# Patient Record
Sex: Female | Born: 1960
Health system: Southern US, Community
[De-identification: ages and names within clinical notes are randomized; demographics above are authoritative.]

## PROBLEM LIST (undated history)

## (undated) DIAGNOSIS — K649 Unspecified hemorrhoids: Secondary | ICD-10-CM

## (undated) DIAGNOSIS — K644 Residual hemorrhoidal skin tags: Secondary | ICD-10-CM

## (undated) DIAGNOSIS — N816 Rectocele: Secondary | ICD-10-CM

## (undated) DIAGNOSIS — E049 Nontoxic goiter, unspecified: Secondary | ICD-10-CM

## (undated) HISTORY — PX: COLONOSCOPY: SHX174

## (undated) HISTORY — DX: Rectocele: N81.6

## (undated) HISTORY — PX: WISDOM TOOTH EXTRACTION: SHX21

## (undated) HISTORY — DX: Unspecified hemorrhoids: K64.9

## (undated) HISTORY — PX: BREAST BIOPSY: SHX20

## (undated) HISTORY — DX: Residual hemorrhoidal skin tags: K64.4

---

## 2008-08-18 ENCOUNTER — Emergency Department (HOSPITAL_COMMUNITY): Admission: EM | Admit: 2008-08-18 | Discharge: 2008-08-18 | Payer: Self-pay | Admitting: Family Medicine

## 2008-08-25 ENCOUNTER — Encounter: Admission: RE | Admit: 2008-08-25 | Discharge: 2008-08-25 | Payer: Self-pay | Admitting: Family Medicine

## 2008-10-12 ENCOUNTER — Encounter: Admission: RE | Admit: 2008-10-12 | Discharge: 2008-10-12 | Payer: Self-pay | Admitting: Family Medicine

## 2008-10-12 ENCOUNTER — Encounter (INDEPENDENT_AMBULATORY_CARE_PROVIDER_SITE_OTHER): Payer: Self-pay | Admitting: Interventional Radiology

## 2008-10-12 ENCOUNTER — Other Ambulatory Visit: Admission: RE | Admit: 2008-10-12 | Discharge: 2008-10-12 | Payer: Self-pay | Admitting: Interventional Radiology

## 2009-02-16 ENCOUNTER — Emergency Department (HOSPITAL_COMMUNITY): Admission: EM | Admit: 2009-02-16 | Discharge: 2009-02-16 | Payer: Self-pay | Admitting: Family Medicine

## 2009-10-05 ENCOUNTER — Encounter: Admission: RE | Admit: 2009-10-05 | Discharge: 2009-10-05 | Payer: Self-pay | Admitting: Endocrinology

## 2009-10-27 ENCOUNTER — Emergency Department (HOSPITAL_COMMUNITY): Admission: EM | Admit: 2009-10-27 | Discharge: 2009-10-27 | Payer: Self-pay | Admitting: Family Medicine

## 2010-12-23 ENCOUNTER — Other Ambulatory Visit: Payer: Self-pay | Admitting: Endocrinology

## 2010-12-23 DIAGNOSIS — E049 Nontoxic goiter, unspecified: Secondary | ICD-10-CM

## 2011-01-08 ENCOUNTER — Ambulatory Visit
Admission: RE | Admit: 2011-01-08 | Discharge: 2011-01-08 | Disposition: A | Discharge status: Home / Self Care | Payer: Commercial Managed Care - PPO | Source: Ambulatory Visit | Attending: Endocrinology | Admitting: Endocrinology

## 2011-01-08 DIAGNOSIS — E049 Nontoxic goiter, unspecified: Secondary | ICD-10-CM

## 2011-12-27 ENCOUNTER — Other Ambulatory Visit: Payer: Self-pay | Admitting: Endocrinology

## 2011-12-27 DIAGNOSIS — E049 Nontoxic goiter, unspecified: Secondary | ICD-10-CM

## 2012-04-09 ENCOUNTER — Ambulatory Visit
Admission: RE | Admit: 2012-04-09 | Discharge: 2012-04-09 | Disposition: A | Discharge status: Home / Self Care | Payer: 59 | Source: Ambulatory Visit | Attending: Endocrinology | Admitting: Endocrinology

## 2012-04-09 DIAGNOSIS — E049 Nontoxic goiter, unspecified: Secondary | ICD-10-CM

## 2013-09-03 ENCOUNTER — Other Ambulatory Visit: Payer: Self-pay | Admitting: Endocrinology

## 2013-09-03 DIAGNOSIS — E049 Nontoxic goiter, unspecified: Secondary | ICD-10-CM

## 2013-09-09 ENCOUNTER — Emergency Department (INDEPENDENT_AMBULATORY_CARE_PROVIDER_SITE_OTHER): Payer: 59

## 2013-09-09 ENCOUNTER — Encounter (HOSPITAL_COMMUNITY): Payer: Self-pay | Admitting: Emergency Medicine

## 2013-09-09 ENCOUNTER — Emergency Department (HOSPITAL_COMMUNITY)
Admission: EM | Admit: 2013-09-09 | Discharge: 2013-09-09 | Disposition: A | Discharge status: Home / Self Care | Payer: 59 | Source: Home / Self Care | Attending: Emergency Medicine | Admitting: Emergency Medicine

## 2013-09-09 DIAGNOSIS — J111 Influenza due to unidentified influenza virus with other respiratory manifestations: Secondary | ICD-10-CM

## 2013-09-09 DIAGNOSIS — J209 Acute bronchitis, unspecified: Secondary | ICD-10-CM

## 2013-09-09 DIAGNOSIS — J208 Acute bronchitis due to other specified organisms: Secondary | ICD-10-CM

## 2013-09-09 HISTORY — DX: Nontoxic goiter, unspecified: E04.9

## 2013-09-09 LAB — POCT RAPID STREP A: Streptococcus, Group A Screen (Direct): NEGATIVE

## 2013-09-09 MED ORDER — PREDNISONE 20 MG PO TABS: 20.0000 mg | ORAL_TABLET | Freq: Two times a day (BID) | ORAL | Status: DC

## 2013-09-09 MED ORDER — HYDROCOD POLST-CHLORPHEN POLST 10-8 MG/5ML PO LQCR: 5.0000 mL | Freq: Two times a day (BID) | ORAL | Status: DC | PRN

## 2013-09-09 MED ORDER — NAPROXEN 500 MG PO TABS: 500.0000 mg | ORAL_TABLET | Freq: Two times a day (BID) | ORAL | Status: DC

## 2013-09-09 MED ORDER — ALBUTEROL SULFATE HFA 108 (90 BASE) MCG/ACT IN AERS: 1.0000 | INHALATION_SPRAY | Freq: Four times a day (QID) | RESPIRATORY_TRACT | Status: DC | PRN

## 2013-09-09 NOTE — ED Notes (Signed)
C/o fever x 1 day and cough in Sept.  Cough never went away.  Fever last Wednesday, Cough became productive on Thursday.  Headache off and on since Monday 9/29- was nauseated on Monday.  Sore throat onset 10/6.  Swollen tonsils noted yesterday.

## 2013-09-09 NOTE — ED Provider Notes (Signed)
Chief Complaint:   Chief Complaint  Patient presents with  . 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 Over the past several days she developed a sore throat, wheezing, headache, nasal congestion, rhinorrhea, sneezing, and clear drainage. She had no definite sick exposures. She denies any GI symptoms.  Review of Systems:  Other than noted above, the patient denies any of the following symptoms: Systemic:  No fevers, chills, sweats, weight loss or gain, fatigue, or tiredness. Eye:  No redness or discharge. ENT:  No ear pain, drainage, headache, nasal congestion, drainage, sinus pressure, difficulty swallowing, or sore throat. Neck:  No neck pain or swollen glands. Lungs:  No cough, sputum production, hemoptysis, wheezing, chest tightness, shortness of breath or chest pain. GI:  No abdominal pain, nausea, vomiting or diarrhea.  PMFSH:  Past medical history, family history, social history, meds, and allergies were reviewed. She takes Loprox and for low thyroid.  Physical Exam:   Vital signs:  BP 126/77  Pulse 63  Temp(Src) 98.1 F (36.7 C) (Oral)  Resp 16  SpO2 98%  LMP 08/07/2013 General:  Alert and oriented.  In no distress.  Skin warm and dry. Eye:  No conjunctival injection or drainage. Lids were normal. ENT:  TMs and canals were normal, without erythema or inflammation.  Nasal mucosa was clear and uncongested, without drainage.  Mucous membranes were moist.  Pharynx was erythematous with no exudate or drainage.  There were no oral ulcerations or lesions. Neck:  Supple, no adenopathy, tenderness or mass. Lungs:  No respiratory distress.  Lungs were clear to auscultation, with very widely scattered wheezes, no rales or rhonchi.  Breath sounds were clear  and equal bilaterally.  Heart:  Regular rhythm, without gallops, murmers or rubs. Skin:  Clear, warm, and dry, without rash or lesions.  Labs:   Results for orders placed during the hospital encounter of 09/09/13  POCT RAPID STREP A ([facility]ARE ONLY)      Result Value Range   Streptococcus, Group A Screen (Direct) NEGATIVE  NEGATIVE     Radiology:  Dg Chest 2 View  09/09/2013   CLINICAL DATA:  Ten day history of cough  EXAM: CHEST  2 VIEW  COMPARISON:  02/16/2009  FINDINGS: The heart size and mediastinal contours are within normal limits. Both lungs are clear. The visualized skeletal structures are unremarkable.  IMPRESSION: No active cardiopulmonary disease.   Electronically Signed   By: Alcide Clever M.D.   On: 09/09/2013 18:36   Assessment:  The primary encounter diagnosis was Viral bronchitis. A diagnosis of Influenza-like illness was also pertinent to this visit.  No indication for antibiotics.  Plan:   1.  Meds:  The following meds were prescribed:   Discharge Medication List as of 09/09/2013  6:49 PM    START taking these medications   Details  albuterol (PROVENTIL HFA;VENTOLIN HFA) 108 (90 BASE) MCG/ACT inhaler Inhale 1-2 puffs into the lungs every 6 (six) hours as needed for wheezing., Starting 09/09/2013, Until Discontinued, Normal    chlorpheniramine-HYDROcodone (TUSSIONEX) 10-8 MG/5ML LQCR Take 5 mLs by mouth every 12 (twelve) hours as needed., Starting 09/09/2013, Until Discontinued, Normal    naproxen (NAPROSYN) 500 MG tablet Take 1 tablet (500 mg total) by mouth 2 (  two) times daily., Starting 09/09/2013, Until Discontinued, Normal    predniSONE (DELTASONE) 20 MG tablet Take 1 tablet (20 mg total) by mouth 2 (two) times daily., Starting 09/09/2013, Until Discontinued, Normal        2.  Patient Education/Counseling:  The patient was given appropriate handouts, self care instructions, and instructed in symptomatic relief.    3.  Follow up:  The patient was told to follow  up if no better in 3 to 4 days, if becoming worse in any way, and given some red flag symptoms such as fever or difficulty breathing which would prompt immediate return.  Follow up here if necessary.      Reuben Likes, MD 09/09/13 2213

## 2013-09-11 LAB — CULTURE, GROUP A STREP

## 2014-04-07 ENCOUNTER — Other Ambulatory Visit: Payer: 59

## 2014-04-14 ENCOUNTER — Other Ambulatory Visit: Payer: Self-pay | Admitting: Endocrinology

## 2014-04-14 DIAGNOSIS — E049 Nontoxic goiter, unspecified: Secondary | ICD-10-CM

## 2014-04-21 ENCOUNTER — Ambulatory Visit
Admission: RE | Admit: 2014-04-21 | Discharge: 2014-04-21 | Disposition: A | Discharge status: Home / Self Care | Payer: 59 | Source: Ambulatory Visit | Attending: Endocrinology | Admitting: Endocrinology

## 2014-04-21 DIAGNOSIS — E049 Nontoxic goiter, unspecified: Secondary | ICD-10-CM

## 2014-11-30 ENCOUNTER — Encounter: Payer: Self-pay | Admitting: Family Medicine

## 2014-11-30 DIAGNOSIS — E039 Hypothyroidism, unspecified: Secondary | ICD-10-CM | POA: Insufficient documentation

## 2014-11-30 DIAGNOSIS — E049 Nontoxic goiter, unspecified: Secondary | ICD-10-CM | POA: Insufficient documentation

## 2014-12-09 ENCOUNTER — Ambulatory Visit (INDEPENDENT_AMBULATORY_CARE_PROVIDER_SITE_OTHER): Payer: 59 | Admitting: Physician Assistant

## 2014-12-09 ENCOUNTER — Encounter: Payer: Self-pay | Admitting: Family Medicine

## 2014-12-09 ENCOUNTER — Encounter: Payer: Self-pay | Admitting: Physician Assistant

## 2014-12-09 VITALS — BP 132/94 | HR 60 | Temp 98.2°F | Resp 18 | Ht 65.25 in | Wt 209.0 lb

## 2014-12-09 DIAGNOSIS — Z803 Family history of malignant neoplasm of breast: Secondary | ICD-10-CM

## 2014-12-09 DIAGNOSIS — Z0000 Encounter for general adult medical examination without abnormal findings: Secondary | ICD-10-CM

## 2014-12-09 DIAGNOSIS — I10 Essential (primary) hypertension: Secondary | ICD-10-CM

## 2014-12-09 DIAGNOSIS — E049 Nontoxic goiter, unspecified: Secondary | ICD-10-CM

## 2014-12-09 LAB — CBC WITH DIFFERENTIAL/PLATELET
BASOS ABS: 0 10*3/uL (ref 0.0–0.1)
BASOS PCT: 0 % (ref 0–1)
EOS ABS: 0.3 10*3/uL (ref 0.0–0.7)
Eosinophils Relative: 3 % (ref 0–5)
HCT: 44.2 % (ref 36.0–46.0)
Hemoglobin: 14.9 g/dL (ref 12.0–15.0)
Lymphocytes Relative: 17 % (ref 12–46)
Lymphs Abs: 1.5 10*3/uL (ref 0.7–4.0)
MCH: 30.2 pg (ref 26.0–34.0)
MCHC: 33.7 g/dL (ref 30.0–36.0)
MCV: 89.7 fL (ref 78.0–100.0)
MONO ABS: 0.8 10*3/uL (ref 0.1–1.0)
MPV: 11 fL (ref 8.6–12.4)
Monocytes Relative: 9 % (ref 3–12)
NEUTROS ABS: 6.3 10*3/uL (ref 1.7–7.7)
NEUTROS PCT: 71 % (ref 43–77)
PLATELETS: 217 10*3/uL (ref 150–400)
RBC: 4.93 MIL/uL (ref 3.87–5.11)
RDW: 13.7 % (ref 11.5–15.5)
WBC: 8.9 10*3/uL (ref 4.0–10.5)

## 2014-12-09 LAB — LIPID PANEL
Cholesterol: 165 mg/dL (ref 0–200)
HDL: 56 mg/dL (ref >39)
LDL CALC: 72 mg/dL (ref 0–99)
TRIGLYCERIDES: 186 mg/dL — AB (ref <150)
Total CHOL/HDL Ratio: 2.9 Ratio
VLDL: 37 mg/dL (ref 0–40)

## 2014-12-09 LAB — COMPLETE METABOLIC PANEL WITH GFR
ALBUMIN: 3.9 g/dL (ref 3.5–5.2)
ALT: 13 U/L (ref 0–35)
AST: 16 U/L (ref 0–37)
Alkaline Phosphatase: 63 U/L (ref 39–117)
BUN: 11 mg/dL (ref 6–23)
CHLORIDE: 103 meq/L (ref 96–112)
CO2: 24 meq/L (ref 19–32)
Calcium: 8.8 mg/dL (ref 8.4–10.5)
Creat: 0.77 mg/dL (ref 0.50–1.10)
GFR, EST NON AFRICAN AMERICAN: 88 mL/min
GFR, Est African American: >89 mL/min
GLUCOSE: 91 mg/dL (ref 70–99)
POTASSIUM: 3.8 meq/L (ref 3.5–5.3)
SODIUM: 137 meq/L (ref 135–145)
Total Bilirubin: 0.8 mg/dL (ref 0.2–1.2)
Total Protein: 6.7 g/dL (ref 6.0–8.3)

## 2014-12-09 MED ORDER — LISINOPRIL 10 MG PO TABS: 10.0000 mg | ORAL_TABLET | Freq: Every day | ORAL | Status: DC

## 2014-12-09 NOTE — Progress Notes (Signed)
Patient ID: Amanda Sampson MRN: [id number], DOB: 1961-01-02, 54 y.o. Date of Encounter: 12/09/2014,   Chief Complaint: Physical (CPE)  HPI: 54 y.o. y/o female  here for CPE.   She is also being seen as a new patient to establish care here.  She says that she actually came here a couple of times many years ago. Says that some of her children and her husband come here.  Says that she came here when she first found out about her thyroid goiter and we referred her to Dr. Soyla Murphy. She continues to see Dr. Soyla Murphy regarding her thyroid. Says that she saw the OB/GYN lots back when she was having her children. He has had 5 children but the youngest of them is now 56 years old. Says otherwise she just really hasn't had any other medical care other than just going to an urgent care if needed.  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 She is concerned about her blood pressure being high. Says that at the lowest it is sometimes in the 130s over 80s but usually is towards 140s over 90s sometimes even 100.  No other complaints or concerns.   Review of Systems: Consitutional: No fever, chills, fatigue, night sweats, lymphadenopathy. No significant/unexplained weight changes. Eyes: No visual changes, eye redness, or discharge. ENT/Mouth: No ear pain, sore throat, nasal drainage, or sinus pain. Cardiovascular: No chest pressure,heaviness, tightness or squeezing, even with exertion. No increased shortness of breath or dyspnea on exertion.No palpitations, edema, orthopnea, PND. Respiratory: No cough, hemoptysis, SOB, or wheezing. Gastrointestinal: No anorexia, dysphagia, reflux, pain, nausea, vomiting, hematemesis, diarrhea, constipation, BRBPR, or melena. Breast: No mass, nodules, bulging, or retraction. No skin changes or inflammation. No nipple discharge. No lymphadenopathy. Genitourinary: No dysuria, hematuria, incontinence, vaginal discharge, pruritis, burning, abnormal bleeding,  or pain. Musculoskeletal: No decreased ROM, No joint pain or swelling. No significant pain in neck, back, or extremities. Skin: No rash, pruritis, or concerning lesions. Neurological: No headache, dizziness, syncope, seizures, tremors, memory loss, coordination problems, or paresthesias. Psychological: No anxiety, depression, hallucinations, SI/HI. Endocrine: No polydipsia, polyphagia, polyuria, or known diabetes.No increased fatigue. No palpitations/rapid heart rate. No significant/unexplained weight change. All other systems were reviewed and are otherwise negative.  Past Medical History  Diagnosis Date  . Goiter     hypothyroid     History reviewed. No pertinent past surgical history.  Home Meds:  Outpatient Prescriptions Prior to Visit  Medication Sig Dispense Refill  . ibuprofen (ADVIL,MOTRIN) 200 MG tablet Take 600 mg by mouth every 6 (six) hours as needed for pain.    Marland Kitchen levothyroxine (SYNTHROID, LEVOTHROID) 112 MCG tablet Take 112 mcg by mouth daily before breakfast.    . Multiple Vitamin (MULTIVITAMIN) tablet Take 1 tablet by mouth daily.    Marland Kitchen albuterol (PROVENTIL HFA;VENTOLIN HFA) 108 (90 BASE) MCG/ACT inhaler Inhale 1-2 puffs into the lungs every 6 (six) hours as needed for wheezing. 1 Inhaler 0  . chlorpheniramine-HYDROcodone (TUSSIONEX) 10-8 MG/5ML LQCR Take 5 mLs by mouth every 12 (twelve) hours as needed. 140 mL 0  . naproxen (NAPROSYN) 500 MG tablet Take 1 tablet (500 mg total) by mouth 2 (two) times daily. 30 tablet 0  . predniSONE (DELTASONE) 20 MG tablet Take 1 tablet (20 mg total) by mouth 2 (two) times daily. 10 tablet 0   No facility-administered medications prior to visit.    Allergies: No Known Allergies  History   Social History  . Marital Status:  Married    Spouse Name: N/A    Number of Children: N/A  . Years of Education: N/A   Occupational History  . Not on file.   Social History Main Topics  . Smoking status: Never Smoker   . Smokeless tobacco:  Never Used  . Alcohol Use: No  . Drug Use: No  . Sexual Activity: No   Other Topics Concern  . 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   She is married.   She has 5 children. The youngest is now 25 years old.    Family History  Problem Relation Age of Onset  . Breast cancer Mother   . Cancer Mother 15    breast  . Pancreatic cancer Father   . Cancer Father     pancreatic  . Hearing loss Father   . Lymphoma Maternal Grandfather   . Cancer Maternal Grandfather     lymphoma  . Alcohol abuse Brother   . Diabetes Maternal Grandmother   . Hearing loss Brother   . Learning disabilities Brother   . Depression Brother   . Depression Brother   . Diabetes Sister     Physical Exam: Blood pressure 132/94, pulse 60, temperature 98.2 F (36.8 C), temperature source Oral, resp. rate 18, height 5' 5.25" (1.657 m), weight 209 lb (94.802 kg)., Body mass index is 34.53 kg/(m^2). General: Obese WF. Appears in no acute distress. HEENT: Normocephalic, atraumatic. Conjunctiva pink, sclera non-icteric. Pupils 2 mm constricting to 1 mm, round, regular, and equally reactive to light and accomodation. EOMI. Internal auditory canal clear. TMs with good cone of light and without pathology. Nasal mucosa pink. Nares are without discharge. No sinus tenderness. Oral mucosa pink.  Pharynx without exudate.   Neck: Supple. Trachea midline. No thyromegaly. Full ROM. No lymphadenopathy.No Carotid Bruits. Lungs: Clear to auscultation bilaterally without wheezes, rales, or rhonchi. Breathing is of normal effort and unlabored. Cardiovascular: RRR with S1 S2. No murmurs, rubs, or gallops. Distal pulses 2+ symmetrically. No carotid or abdominal bruits. Breast: Symmetrical. No masses. Nipples without discharge. Abdomen: Soft, non-tender, non-distended with normoactive bowel sounds. No hepatosplenomegaly or masses. No rebound/guarding.  No CVA tenderness. No hernias.  Genitourinary:  External genitalia without lesions. Vaginal mucosa pink.No discharge present. Cervix pink and without discharge. No cervical tenderness.Normal uterus size. No adnexal mass or tenderness.  Pap smear taken Musculoskeletal: Full range of motion and 5/5 strength throughout. Without swelling, atrophy, tenderness, crepitus, or warmth. Extremities without clubbing, cyanosis, or edema. Calves supple. Skin: Warm and moist without erythema, ecchymosis, wounds, or rash. Neuro: A+Ox3. CN II-XII grossly intact. Moves all extremities spontaneously. Full sensation throughout. Normal gait. DTR 2+ throughout upper and lower extremities. Finger to nose intact. Psych:  Responds to questions appropriately with a normal affect.   Assessment/Plan:  54 y.o. y/o female here for CPE 1. Visit for preventive health examination  A. Screening Labs:  - CBC with Differential - COMPLETE METABOLIC PANEL WITH GFR - Lipid panel - Vit D  25 hydroxy (rtn osteoporosis monitoring)  B. Pap: - PAP, Thin Prep w/HPV rflx HPV Type 16/18  C. Screening Mammogram: - MM Digital Screening; Future   D. DEXA/BMD:  Wait until closer to age 2 to start doing density.  E. Colorectal Cancer Screening: Discussed screening with patient today. Discussed scheduling for colonoscopy but she defers at this time. Discussed Hemoccults but she  has significant hemorrhoids so I do not think that Hemoccults will get [place] far . If they are positive then we will just simply think that they're secondary to the hemorrhoids. She says that she will be considering colonoscopy and will let us know if she wants [place] to schedule but right now refuses. She is well aware of risk versus benefits of me at discussed this today.  F. Immunizations:  Influenza: She received influenza vaccine 09/02/14 Tetanus:  She received TD 12/04/1995.----- this will be due next year in 2017 Pneumococcal: She has no indication to require  a pneumonia vaccine until age 4. Zostavax: Not indicated until age 21.   2. Family history of breast cancer in mother - MM Digital Screening; Future   3. Essential hypertension - lisinopril (PRINIVIL,ZESTRIL) 10 MG tablet; Take 1 tablet (10 mg total) by mouth daily.  Dispense: 30 tablet; Refill: 0 - BASIC METABOLIC PANEL WITH GFR; Future  Will add lisinopril 10 mg daily for blood pressure. 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 She can return here in 2 weeks to recheck BMP T on medication. If this is normal and her blood pressure is controlled then she can wait 6 months for follow-up office visit.  4. Goiter Per Dr. Soyla Murphy  Return in 2 weeks to check BMP T. Regular office visit 6 months or sooner if needed.   Signed, [address] [place], [place], [facility] 12/09/2014 10:12 AM

## 2014-12-10 LAB — VITAMIN D 25 HYDROXY (VIT D DEFICIENCY, FRACTURES): VIT D 25 HYDROXY: 20 ng/mL — AB (ref 30–100)

## 2014-12-13 LAB — PAP, THIN PREP W/HPV RFLX HPV TYPE 16/18: HPV DNA HIGH RISK: NOT DETECTED

## 2014-12-15 ENCOUNTER — Encounter: Payer: Self-pay | Admitting: Family Medicine

## 2014-12-15 ENCOUNTER — Ambulatory Visit: Payer: 59 | Admitting: Physician Assistant

## 2014-12-28 ENCOUNTER — Other Ambulatory Visit: Payer: 59

## 2014-12-28 DIAGNOSIS — I10 Essential (primary) hypertension: Secondary | ICD-10-CM

## 2014-12-29 LAB — BASIC METABOLIC PANEL WITH GFR
BUN: 16 mg/dL (ref 6–23)
CHLORIDE: 104 meq/L (ref 96–112)
CO2: 29 meq/L (ref 19–32)
CREATININE: 0.74 mg/dL (ref 0.50–1.10)
Calcium: 9.1 mg/dL (ref 8.4–10.5)
GLUCOSE: 108 mg/dL — AB (ref 70–99)
POTASSIUM: 3.9 meq/L (ref 3.5–5.3)
SODIUM: 139 meq/L (ref 135–145)

## 2015-01-06 ENCOUNTER — Telehealth: Payer: Self-pay | Admitting: Family Medicine

## 2015-01-06 DIAGNOSIS — I10 Essential (primary) hypertension: Secondary | ICD-10-CM

## 2015-01-06 MED ORDER — LISINOPRIL 10 MG PO TABS: 10.0000 mg | ORAL_TABLET | Freq: Every day | ORAL | Status: DC

## 2015-01-06 NOTE — Telephone Encounter (Signed)
Medication refilled per protocol. 

## 2015-04-20 ENCOUNTER — Other Ambulatory Visit: Payer: Self-pay | Admitting: Endocrinology

## 2015-04-20 DIAGNOSIS — E038 Other specified hypothyroidism: Secondary | ICD-10-CM

## 2015-05-13 ENCOUNTER — Encounter: Payer: Self-pay | Admitting: Family Medicine

## 2015-05-13 ENCOUNTER — Ambulatory Visit (INDEPENDENT_AMBULATORY_CARE_PROVIDER_SITE_OTHER): Payer: 59 | Admitting: Family Medicine

## 2015-05-13 VITALS — BP 118/72 | HR 66 | Temp 98.4°F | Resp 14 | Ht 65.25 in | Wt 203.0 lb

## 2015-05-13 DIAGNOSIS — D485 Neoplasm of uncertain behavior of skin: Secondary | ICD-10-CM | POA: Diagnosis not present

## 2015-05-13 NOTE — Progress Notes (Signed)
   Subjective:    Patient ID: Amanda Sampson, female    DOB: 10-10-1961, 54 y.o.   MRN: [id number]  HPI Patient has a rapidly enlarging lesion below her left eye. It is approximately 5 mm in size today it is an erythematous domed papule. It appears to be either an inflamed dermal nevus, a pyogenic granuloma, or possibly a basal cell cancer. She is here today requesting a biopsy and surgical removal. However this lesion is located in close proximity to her left lower eyelid. Given its location and its size, I think she would be better managed by dermatologist with a more appealing cosmetic result then what I am capable of.  Patient understands and would like a referral to dermatology Past Medical History  Diagnosis Date  . Goiter     hypothyroid   No past surgical history on file. Current Outpatient Prescriptions on File Prior to Visit  Medication Sig Dispense Refill  . ibuprofen (ADVIL,MOTRIN) 200 MG tablet Take 600 mg by mouth every 6 (six) hours as needed for pain.    Marland Kitchen levothyroxine (SYNTHROID, LEVOTHROID) 112 MCG tablet Take 112 mcg by mouth daily before breakfast.    . Multiple Vitamin (MULTIVITAMIN) tablet Take 1 tablet by mouth daily.    Marland Kitchen lisinopril (PRINIVIL,ZESTRIL) 10 MG tablet Take 1 tablet (10 mg total) by mouth daily. (Patient not taking: Reported on 05/13/2015) 90 tablet 1   No current facility-administered medications on file prior to visit.   No Known Allergies History   Social History  . Marital Status: Married    Spouse Name: N/A  . Number of Children: N/A  . Years of Education: N/A   Occupational History  . Not on file.   Social History Main Topics  . Smoking status: Never Smoker   . Smokeless tobacco: Never Used  . Alcohol Use: No  . Drug Use: No  . Sexual Activity: No   Other Topics Concern  . 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   She is married.   She has 5  children. The youngest is now 29 years old.      Review of Systems  All other systems reviewed and are negative.      Objective:   Physical Exam  Vitals reviewed.  please see the description in the history of present illness        Assessment & Plan:  Neoplasm of uncertain behavior of skin  I will schedule the patient see a dermatologist. I apologized to the patient for any waste of time today was however I just feel a dermatologist can do a better job than I'm capable of in removing this lesion

## 2015-06-08 ENCOUNTER — Ambulatory Visit: Payer: 59 | Admitting: Physician Assistant

## 2015-06-15 ENCOUNTER — Encounter (HOSPITAL_COMMUNITY): Payer: Self-pay | Admitting: *Deleted

## 2015-06-15 ENCOUNTER — Emergency Department (INDEPENDENT_AMBULATORY_CARE_PROVIDER_SITE_OTHER): Payer: 59

## 2015-06-15 ENCOUNTER — Emergency Department (HOSPITAL_COMMUNITY)
Admission: EM | Admit: 2015-06-15 | Discharge: 2015-06-15 | Disposition: A | Discharge status: Home / Self Care | Payer: 59 | Source: Home / Self Care | Attending: Family Medicine | Admitting: Family Medicine

## 2015-06-15 DIAGNOSIS — J189 Pneumonia, unspecified organism: Secondary | ICD-10-CM | POA: Diagnosis not present

## 2015-06-15 MED ORDER — MOXIFLOXACIN HCL 400 MG PO TABS: 400.0000 mg | ORAL_TABLET | Freq: Every day | ORAL | Status: DC

## 2015-06-15 MED ORDER — CEFTRIAXONE SODIUM 1 G IJ SOLR
1.0000 g | Freq: Once | INTRAMUSCULAR | Status: AC
  Administered 2015-06-15: 1 g via INTRAMUSCULAR

## 2015-06-15 MED ORDER — CEFTRIAXONE SODIUM 1 G IJ SOLR
INTRAMUSCULAR | Status: AC
  Filled 2015-06-15: qty 10

## 2015-06-15 NOTE — Discharge Instructions (Signed)
Drink plenty of fluids as discussed, use medicine as prescribed, and mucinex or delsym for cough. see your doctor in 3-4 wks for repeat xray, sooner if any problems.

## 2015-06-15 NOTE — ED Notes (Signed)
Pt  Reports   Symptoms  Of  Cough   /   Congested      With  Fever  At  Home   With  Onset  Of  Symptoms      X  4  Days     She  Reports  Some tightness in  Her  Chest  And  Reports  Used an albuterol inhaler  Which  She  States  She  Had  At home

## 2015-06-15 NOTE — ED Provider Notes (Signed)
CSN: [id number]     Arrival date & time 06/15/15  1357 History   First MD Initiated Contact with Patient 06/15/15 1455     Chief Complaint  Patient presents with  . URI   (Consider location/radiation/quality/duration/timing/severity/associated sxs/prior Treatment) Patient is a 54 y.o. female presenting with URI. The history is provided by the patient.  URI Presenting symptoms: congestion, cough and fever   Presenting symptoms: no rhinorrhea and no sore throat   Severity:  Moderate Onset quality:  Gradual Duration:  5 days Progression:  Worsening Chronicity:  New Relieved by:  None tried Worsened by:  Nothing tried Ineffective treatments:  None tried Associated symptoms: no wheezing   Risk factors: sick contacts     Past Medical History  Diagnosis Date  . Goiter     hypothyroid   History reviewed. No pertinent past surgical history. Family History  Problem Relation Age of Onset  . Breast cancer Mother   . Cancer Mother 68    breast  . Pancreatic cancer Father   . Cancer Father     pancreatic  . Hearing loss Father   . Lymphoma Maternal Grandfather   . Cancer Maternal Grandfather     lymphoma  . Alcohol abuse Brother   . Diabetes Maternal Grandmother   . Hearing loss Brother   . Learning disabilities Brother   . Depression Brother   . Depression Brother   . Diabetes Sister    History  Substance Use Topics  . Smoking status: Never Smoker   . Smokeless tobacco: Never Used  . Alcohol Use: No   OB History    No data available     Review of Systems  Constitutional: Positive for fever.  HENT: Positive for congestion. Negative for rhinorrhea and sore throat.   Respiratory: Positive for cough. Negative for shortness of breath and wheezing.   Cardiovascular: Negative.     Allergies  Review of patient's allergies indicates no known allergies.  Home Medications   Prior to Admission medications   Medication Sig Start Date End Date Taking? Authorizing  Provider  ibuprofen (ADVIL,MOTRIN) 200 MG tablet Take 600 mg by mouth every 6 (six) hours as needed for pain.    Historical Provider, MD  levothyroxine (SYNTHROID, LEVOTHROID) 112 MCG tablet Take 112 mcg by mouth daily before breakfast.    Historical Provider, MD  lisinopril (PRINIVIL,ZESTRIL) 10 MG tablet Take 1 tablet (10 mg total) by mouth daily. Patient not taking: Reported on 05/13/2015 01/06/15   Orlena Sheldon, PA-C  moxifloxacin (AVELOX) 400 MG tablet Take 1 tablet (400 mg total) by mouth daily. One tab daily 06/15/15   Billy Fischer, MD  Multiple Vitamin (MULTIVITAMIN) tablet Take 1 tablet by mouth daily.    Historical Provider, MD   BP 124/76 mmHg  Pulse 88  Temp(Src) 99 F (37.2 C) (Oral)  Resp 18  SpO2 96%  LMP  (LMP Unknown) Physical Exam  Constitutional: She is oriented to person, place, and time. She appears well-developed and well-nourished. No distress.  HENT:  Mouth/Throat: Oropharynx is clear and moist.  Neck: Normal range of motion. Neck supple.  Cardiovascular: Normal rate, regular rhythm, normal heart sounds and intact distal pulses.   Pulmonary/Chest: Effort normal. She has decreased breath sounds. She has rales in the left lower field.  Lymphadenopathy:    She has no cervical adenopathy.  Neurological: She is alert and oriented to person, place, and time.  Skin: Skin is warm and dry.  Nursing note and vitals  reviewed.   ED Course  Procedures (including critical care time) Labs Review Labs Reviewed - No data to display  Imaging Review Dg Chest 2 View  06/15/2015   CLINICAL DATA:  54 year old female with fever, cough, chills and wheezing  EXAM: CHEST  2 VIEW  COMPARISON:  Prior chest x-ray 09/09/2013  FINDINGS: Mild of focal patchy airspace opacity in the anterior aspect of the left lower lobe superimposed on the left heart shadow on the frontal view. This is an interval finding compared to the prior radiograph. The cardiac and mediastinal contours are within  normal limits. The lungs are otherwise clear. No acute osseous abnormality.  IMPRESSION: New patchy airspace opacity in the anterior aspect of the left lower lobe concerning for early bronchopneumonia. Followup PA and lateral chest X-ray is recommended in 3-4 weeks following trial of antibiotic therapy to ensure resolution and exclude underlying malignancy.   Electronically Signed   By: Jacqulynn Cadet M.D.   On: 06/15/2015 15:29     MDM   1. CAP (community acquired pneumonia)        Billy Fischer, MD 06/15/15 [phone]

## 2015-07-13 ENCOUNTER — Ambulatory Visit: Payer: 59 | Admitting: Family Medicine

## 2015-07-15 ENCOUNTER — Ambulatory Visit: Payer: 59 | Admitting: Family Medicine

## 2015-09-21 ENCOUNTER — Encounter: Payer: Self-pay | Admitting: Physician Assistant

## 2016-01-06 MED FILL — LEVOTHYROXINE 112 MCG TAB: 112 | 90 days supply | Qty: 90 | Fill #0

## 2016-04-11 ENCOUNTER — Ambulatory Visit
Admission: RE | Admit: 2016-04-11 | Discharge: 2016-04-11 | Disposition: A | Discharge status: Home / Self Care | Payer: 59 | Source: Ambulatory Visit | Attending: Endocrinology | Admitting: Endocrinology

## 2016-04-11 DIAGNOSIS — E042 Nontoxic multinodular goiter: Secondary | ICD-10-CM | POA: Diagnosis not present

## 2016-04-11 DIAGNOSIS — E039 Hypothyroidism, unspecified: Secondary | ICD-10-CM | POA: Diagnosis not present

## 2016-04-11 DIAGNOSIS — E038 Other specified hypothyroidism: Secondary | ICD-10-CM

## 2016-04-18 DIAGNOSIS — E049 Nontoxic goiter, unspecified: Secondary | ICD-10-CM | POA: Diagnosis not present

## 2016-04-18 DIAGNOSIS — E039 Hypothyroidism, unspecified: Secondary | ICD-10-CM | POA: Diagnosis not present

## 2016-04-19 MED FILL — LEVOTHYROXINE 112 MCG TAB: 112 | 90 days supply | Qty: 90 | Fill #1

## 2016-07-24 MED FILL — LEVOTHYROXINE 112 MCG TAB: 112 | 90 days supply | Qty: 90 | Fill #2

## 2016-09-27 ENCOUNTER — Ambulatory Visit (INDEPENDENT_AMBULATORY_CARE_PROVIDER_SITE_OTHER): Payer: 59 | Admitting: Physician Assistant

## 2016-09-27 ENCOUNTER — Encounter: Payer: Self-pay | Admitting: Physician Assistant

## 2016-09-27 VITALS — BP 118/76 | HR 70 | Temp 97.9°F | Resp 16 | Ht 65.25 in | Wt 192.0 lb

## 2016-09-27 DIAGNOSIS — Z23 Encounter for immunization: Secondary | ICD-10-CM | POA: Diagnosis not present

## 2016-09-27 DIAGNOSIS — Z0000 Encounter for general adult medical examination without abnormal findings: Secondary | ICD-10-CM

## 2016-09-27 LAB — CBC WITH DIFFERENTIAL/PLATELET
BASOS ABS: 0 {cells}/uL (ref 0–200)
BASOS PCT: 0 %
EOS ABS: 320 {cells}/uL (ref 15–500)
Eosinophils Relative: 5 %
HEMATOCRIT: 43.9 % (ref 35.0–45.0)
Hemoglobin: 14.6 g/dL (ref 12.0–15.0)
LYMPHS PCT: 24 %
Lymphs Abs: 1536 cells/uL (ref 850–3900)
MCH: 30.5 pg (ref 27.0–33.0)
MCHC: 33.3 g/dL (ref 32.0–36.0)
MCV: 91.6 fL (ref 80.0–100.0)
MONO ABS: 704 {cells}/uL (ref 200–950)
MPV: 11.4 fL (ref 7.5–12.5)
Monocytes Relative: 11 %
Neutro Abs: 3840 cells/uL (ref 1500–7800)
Neutrophils Relative %: 60 %
Platelets: 188 10*3/uL (ref 140–400)
RBC: 4.79 MIL/uL (ref 3.80–5.10)
RDW: 13.5 % (ref 11.0–15.0)
WBC: 6.4 10*3/uL (ref 3.8–10.8)

## 2016-09-27 LAB — LIPID PANEL
CHOLESTEROL: 178 mg/dL (ref 125–200)
HDL: 53 mg/dL (ref >=46)
LDL Cholesterol: 97 mg/dL (ref <130)
TRIGLYCERIDES: 141 mg/dL (ref <150)
Total CHOL/HDL Ratio: 3.4 Ratio (ref <=5.0)
VLDL: 28 mg/dL (ref <30)

## 2016-09-27 LAB — COMPLETE METABOLIC PANEL WITH GFR
ALT: 13 U/L (ref 6–29)
AST: 16 U/L (ref 10–35)
Albumin: 4.1 g/dL (ref 3.6–5.1)
Alkaline Phosphatase: 78 U/L (ref 33–130)
BILIRUBIN TOTAL: 0.6 mg/dL (ref 0.2–1.2)
BUN: 14 mg/dL (ref 7–25)
CALCIUM: 9.5 mg/dL (ref 8.6–10.4)
CO2: 27 mmol/L (ref 20–31)
CREATININE: 0.93 mg/dL (ref 0.50–1.05)
Chloride: 103 mmol/L (ref 98–110)
GFR, EST AFRICAN AMERICAN: 79 mL/min (ref >=60)
GFR, EST NON AFRICAN AMERICAN: 69 mL/min (ref >=60)
Glucose, Bld: 84 mg/dL (ref 70–99)
Potassium: 3.9 mmol/L (ref 3.5–5.3)
Sodium: 141 mmol/L (ref 135–146)
TOTAL PROTEIN: 7.1 g/dL (ref 6.1–8.1)

## 2016-09-27 NOTE — Progress Notes (Signed)
Patient ID: Amanda Sampson MRN: [id number], DOB: 05/03/1961, 55 y.o. Date of Encounter: 09/27/2016,   Chief Complaint: Physical (CPE)  HPI: 55 y.o. y/o female  here for CPE.    12/09/2014: She presents for CPE. She is also being seen as a new patient to establish care here.  She says that she actually came here a couple of times many years ago. Says that some of her children and her husband come here.  Says that she came here when she first found out about her thyroid goiter and we referred her to Dr. Soyla Sampson. She continues to see Dr. Soyla Sampson regarding her thyroid. Says that she saw the OB/GYN lots back when she was having her children. She has had 5 children but the youngest of them is now 56 years old. Says otherwise she just really hasn't had any other medical care other than just going to an urgent care if needed.  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 She is concerned about her blood pressure being high. Says that at the lowest-- it is sometimes in the 130s over 80s but usually is towards 140s over 90s sometimes even 100.  09/27/2016: She presents for complete physical exam. 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 They "help to provide them with things that they need to get back on their feet". She states that she is still working at [employer] but only 12 hours a week--"enough to keep her insurance through them." I noted that her blood pressure pill had been marked discontinued/no longer taking----she states that she thinks that back last year when she had to start that that it must of been secondary to increased stress. Says that [facility] and [facility] pressure has been good off of medication. She has no complaints or concerns today. She still sees Dr.  Soyla Sampson for her thyroid.  No other complaints or concerns.   Review of Systems: Consitutional: No fever, chills, fatigue, night sweats, lymphadenopathy. No significant/unexplained weight changes. Eyes: No visual changes, eye redness, or discharge. ENT/Mouth: No ear pain, sore throat, nasal drainage, or sinus pain. Cardiovascular: No chest pressure,heaviness, tightness or squeezing, even with exertion. No increased shortness of breath or dyspnea on exertion.No palpitations, edema, orthopnea, PND. Respiratory: No cough, hemoptysis, SOB, or wheezing. Gastrointestinal: No anorexia, dysphagia, reflux, pain, nausea, vomiting, hematemesis, diarrhea, constipation, BRBPR, or melena. Breast: No mass, nodules, bulging, or retraction. No skin changes or inflammation. No nipple discharge. No lymphadenopathy. Genitourinary: No dysuria, hematuria, incontinence, vaginal discharge, pruritis, burning, abnormal bleeding, or pain. Musculoskeletal: No decreased ROM, No joint pain or swelling. No significant pain in neck, back, or extremities. Skin: No rash, pruritis, or concerning lesions. Neurological: No headache, dizziness, syncope, seizures, tremors, memory loss, coordination problems, or paresthesias. Psychological: No anxiety, depression, hallucinations, SI/HI. Endocrine: No polydipsia, polyphagia, polyuria, or known diabetes.No increased fatigue. No palpitations/rapid heart rate. No significant/unexplained weight change. All other systems were reviewed and are otherwise negative.  Past Medical History:  Diagnosis Date  . Goiter    hypothyroid     No past surgical history on file.  Home Meds:  Outpatient Medications Prior to Visit  Medication Sig Dispense Refill  . ibuprofen (ADVIL,MOTRIN) 200 MG tablet Take 600 mg by mouth  every 6 (six) hours as needed for pain.    Marland Kitchen levothyroxine (SYNTHROID, LEVOTHROID) 112 MCG tablet Take 112 mcg by mouth daily before breakfast.    . Multiple Vitamin  (MULTIVITAMIN) tablet Take 1 tablet by mouth daily.    Marland Kitchen lisinopril (PRINIVIL,ZESTRIL) 10 MG tablet Take 1 tablet (10 mg total) by mouth daily. (Patient not taking: Reported on 09/27/2016) 90 tablet 1  . moxifloxacin (AVELOX) 400 MG tablet Take 1 tablet (400 mg total) by mouth daily. One tab daily (Patient not taking: Reported on 09/27/2016) 7 tablet 0   No facility-administered medications prior to visit.     Allergies: No Known Allergies  Social History   Social History  . Marital status: Married    Spouse name: N/A  . Number of children: N/A  . Years of education: N/A   Occupational History  . Not on file.   Social History Main Topics  . Smoking status: Never Smoker  . Smokeless tobacco: Never Used  . Alcohol use No  . Drug use: No  . Sexual activity: No   Other Topics Concern  . 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   She is married.   She has 5 children. The youngest is now 90 years old.    Family History  Problem Relation Age of Onset  . Breast cancer Mother   . Cancer Mother 11    breast  . Pancreatic cancer Father   . Cancer Father     pancreatic  . Hearing loss Father   . Lymphoma Maternal Grandfather   . Cancer Maternal Grandfather     lymphoma  . Alcohol abuse Brother   . Diabetes Maternal Grandmother   . Hearing loss Brother   . Learning disabilities Brother   . Depression Brother   . Depression Brother   . Diabetes Sister     Physical Exam: Blood pressure 118/76, pulse 70, temperature 97.9 F (36.6 C), temperature source Oral, resp. rate 16, height 5' 5.25" (1.657 m), weight 192 lb (87.1 kg), SpO2 98 %., There is no height or weight on file to calculate BMI. General: Obese WF. Appears in no acute distress. HEENT: Normocephalic, atraumatic. Conjunctiva pink, sclera non-icteric. Pupils 2 mm constricting to 1 mm, round, regular, and equally reactive to light and  accomodation. EOMI. Internal auditory canal clear. TMs with good cone of light and without pathology. Nasal mucosa pink. Nares are without discharge. No sinus tenderness. Oral mucosa pink.  Pharynx without exudate.   Neck: Supple. Trachea midline. No thyromegaly. Full ROM. No lymphadenopathy.No Carotid Bruits. Lungs: Clear to auscultation bilaterally without wheezes, rales, or rhonchi. Breathing is of normal effort and unlabored. Cardiovascular: RRR with S1 S2. No murmurs, rubs, or gallops. Distal pulses 2+ symmetrically. No carotid or abdominal bruits. Breast: Symmetrical. No masses. Nipples without discharge. Abdomen: Soft, non-tender, non-distended with normoactive bowel sounds. No hepatosplenomegaly or masses. No rebound/guarding. No CVA tenderness. No hernias.  Genitourinary:  External genitalia without lesions. Vaginal mucosa pink.No discharge present. Cervix pink and without discharge. No cervical tenderness.Normal uterus size. No adnexal mass or tenderness.  Musculoskeletal: Full range of motion and 5/5 strength throughout. Without swelling, atrophy, tenderness, crepitus, or warmth. Extremities without clubbing, cyanosis, or edema. Calves supple. Skin: Warm and moist without erythema, ecchymosis, wounds, or rash. Neuro: A+Ox3. CN II-XII grossly intact. Moves all extremities spontaneously. Full sensation throughout. Normal gait.  DTR 2+ throughout upper and lower extremities. Finger to nose intact. Psych:  Responds to questions appropriately with a normal affect.   Assessment/Plan:  55 y.o. y/o female here for CPE  1. Visit for preventive health examination  A. Screening Labs: - CBC with Differential - COMPLETE METABOLIC PANEL WITH GFR - Lipid panel (Will not check TSH as Dr. Soyla Sampson manages her thyroid)  B. Pap: - PAP, Thin Prep w/HPV rflx HPV Type 16/18----this was checked at last physical and was normal cytology and negative HPV. Can wait 3-5 years to repeat.  C. Screening  Mammogram: Ordered a mammogram at her last physical but today she states that she did not go have that done. I discussed that she really needs to have this done especially given her family history and she voices understanding and agrees. - MM Digital Screening; Future   D. DEXA/BMD:  Wait until closer to age 44 to start doing density.  E. Colorectal Cancer Screening: Discussed screening with patient today. Discussed scheduling for colonoscopy but she defers at this time. Discussed Hemoccults but she has significant hemorrhoids so I do not think that Hemoccults will get [place] far . If they are positive then we will just simply think that they're secondary to the hemorrhoids. She says that she will be considering colonoscopy and will let us know if she wants [place] to schedule but right now refuses. She is well aware of risk versus benefits of me at discussed this today. He had the above conversation at her first physical with me and discuss this again at visit 09/2016 but she still refuses colonoscopy or Hemoccult.  F. Immunizations:  Influenza: She received influenza vaccine 09/02/14, 08/26/2016 Tetanus:  She received TD 12/04/1995.----- this is due--she is agreeable to receive today---Given here 09/27/2016 Pneumococcal: She has no indication to require a pneumonia vaccine until age 53. Zostavax: Not indicated until age 73.   2. Family history of breast cancer in mother - MM Digital Screening; Future  3. Goiter Per Dr. Soyla Sampson     Signed, [facility] [place], [place], [facility] 09/27/2016 8:49 AM

## 2016-09-27 NOTE — Addendum Note (Signed)
Addended by: Vonna Kotyk A on: 09/27/2016 10:00 AM   Modules accepted: Orders

## 2016-09-28 ENCOUNTER — Other Ambulatory Visit: Payer: Self-pay | Admitting: Family Medicine

## 2016-09-28 DIAGNOSIS — Z1231 Encounter for screening mammogram for malignant neoplasm of breast: Secondary | ICD-10-CM

## 2016-10-17 ENCOUNTER — Ambulatory Visit: Payer: 59

## 2016-10-30 ENCOUNTER — Ambulatory Visit
Admission: RE | Admit: 2016-10-30 | Discharge: 2016-10-30 | Disposition: A | Discharge status: Home / Self Care | Payer: 59 | Source: Ambulatory Visit | Attending: Physician Assistant | Admitting: Physician Assistant

## 2016-10-30 DIAGNOSIS — Z1231 Encounter for screening mammogram for malignant neoplasm of breast: Secondary | ICD-10-CM | POA: Diagnosis not present

## 2016-10-30 MED FILL — LEVOTHYROXINE 112 MCG TAB: 112 | 30 days supply | Qty: 30 | Fill #3

## 2016-11-06 ENCOUNTER — Other Ambulatory Visit: Payer: Self-pay | Admitting: Physician Assistant

## 2016-11-06 DIAGNOSIS — R928 Other abnormal and inconclusive findings on diagnostic imaging of breast: Secondary | ICD-10-CM

## 2016-11-13 ENCOUNTER — Ambulatory Visit
Admission: RE | Admit: 2016-11-13 | Discharge: 2016-11-13 | Disposition: A | Discharge status: Home / Self Care | Payer: 59 | Source: Ambulatory Visit | Attending: Physician Assistant | Admitting: Physician Assistant

## 2016-11-13 DIAGNOSIS — N6002 Solitary cyst of left breast: Secondary | ICD-10-CM | POA: Diagnosis not present

## 2016-11-13 DIAGNOSIS — R928 Other abnormal and inconclusive findings on diagnostic imaging of breast: Secondary | ICD-10-CM

## 2016-11-13 DIAGNOSIS — N6320 Unspecified lump in the left breast, unspecified quadrant: Secondary | ICD-10-CM | POA: Diagnosis not present

## 2016-11-30 MED FILL — LEVOTHYROXINE 112 MCG TAB: 112 | 30 days supply | Qty: 30 | Fill #4

## 2017-01-02 MED FILL — LEVOTHYROXINE 112 MCG TAB: 112 | 30 days supply | Qty: 30 | Fill #5

## 2017-02-06 MED FILL — LEVOTHYROXINE 112 MCG TAB: 112 | 90 days supply | Qty: 90 | Fill #0

## 2017-05-14 MED FILL — LEVOTHYROXINE 112 MCG TAB: 112 | 90 days supply | Qty: 90 | Fill #1

## 2017-08-19 MED FILL — LEVOTHYROXINE 112 MCG TAB: 112 | 90 days supply | Qty: 90 | Fill #2

## 2017-11-19 MED FILL — LEVOTHYROXINE 112 MCG TABLE: 112 | 90 days supply | Qty: 90 | Fill #3

## 2017-12-10 ENCOUNTER — Other Ambulatory Visit: Payer: Self-pay | Admitting: Physician Assistant

## 2017-12-10 DIAGNOSIS — R921 Mammographic calcification found on diagnostic imaging of breast: Secondary | ICD-10-CM

## 2017-12-20 ENCOUNTER — Ambulatory Visit
Admission: RE | Admit: 2017-12-20 | Discharge: 2017-12-20 | Disposition: A | Discharge status: Home / Self Care | Payer: 59 | Source: Ambulatory Visit | Attending: Physician Assistant | Admitting: Physician Assistant

## 2017-12-20 DIAGNOSIS — R921 Mammographic calcification found on diagnostic imaging of breast: Secondary | ICD-10-CM

## 2018-01-29 ENCOUNTER — Telehealth: Payer: Self-pay | Admitting: Physician Assistant

## 2018-01-29 NOTE — Telephone Encounter (Signed)
Pt wants to switch from [facility] to [facility] for p[facility], she is wanting to schedule a cpe and I did not want to schedule until I know that it is okay? Please advise I will call and schedule app with with either him or [facility].

## 2018-01-30 NOTE — Telephone Encounter (Signed)
sure

## 2018-02-17 ENCOUNTER — Encounter: Payer: Self-pay | Admitting: Family Medicine

## 2018-02-17 ENCOUNTER — Ambulatory Visit (INDEPENDENT_AMBULATORY_CARE_PROVIDER_SITE_OTHER): Payer: 59 | Admitting: Family Medicine

## 2018-02-17 VITALS — BP 110/64 | HR 68 | Temp 98.0°F | Resp 16 | Ht 67.0 in | Wt 177.0 lb

## 2018-02-17 DIAGNOSIS — Z1211 Encounter for screening for malignant neoplasm of colon: Secondary | ICD-10-CM

## 2018-02-17 DIAGNOSIS — Z0000 Encounter for general adult medical examination without abnormal findings: Secondary | ICD-10-CM | POA: Diagnosis not present

## 2018-02-17 DIAGNOSIS — E039 Hypothyroidism, unspecified: Secondary | ICD-10-CM

## 2018-02-17 DIAGNOSIS — K644 Residual hemorrhoidal skin tags: Secondary | ICD-10-CM | POA: Diagnosis not present

## 2018-02-17 NOTE — Addendum Note (Signed)
Addended by: Jenna Luo T on: 02/17/2018 04:31 PM   Modules accepted: Orders

## 2018-02-17 NOTE — Progress Notes (Addendum)
Subjective:    Patient ID: Amanda Sampson, female    DOB: 07-29-1961, 57 y.o.   MRN: [id number]  HPI  Patient is a 57 y/o WF here for CPE.  She is reportedly due for pap smear, HIV/Hep C screening, and a flu shot.  Last mammogram was 12/20/17. Patient is also due for a colonoscopy.  She politely defers hepatitis C and HIV screening.  She would like me to schedule her for a colonoscopy.  She is also due for fasting lab work.  She is also complaining of hemorrhoids and she is interested in surgical options to correct those.  Past Medical History:  Diagnosis Date  . Goiter    hypothyroid   No past surgical history on file. Current Outpatient Medications on File Prior to Visit  Medication Sig Dispense Refill  . ibuprofen (ADVIL,MOTRIN) 200 MG tablet Take 600 mg by mouth every 6 (six) hours as needed for pain.    Marland Kitchen levothyroxine (SYNTHROID, LEVOTHROID) 112 MCG tablet Take 112 mcg by mouth daily before breakfast.    . Multiple Vitamin (MULTIVITAMIN) tablet Take 1 tablet by mouth daily.     No current facility-administered medications on file prior to visit.   No Known Allergies Social History   Socioeconomic History  . Marital status: Married    Spouse name: Not on file  . Number of children: Not on file  . Years of education: Not on file  . Highest education level: Not on file  Social Needs  . Financial resource strain: Not on file  . Food insecurity - worry: Not on file  . Food insecurity - inability: Not on file  . Transportation needs - medical: Not on file  . Transportation needs - non-medical: Not on file  Occupational History  . Not on file  Tobacco Use  . Smoking status: Never Smoker  . Smokeless tobacco: Never Used  Substance and Sexual Activity  . Alcohol use: No    Alcohol/week: 0.0 oz  . Drug use: No  . Sexual activity: No    Birth control/protection: None  Other Topics Concern  . 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   She is married.   She has 5 children. The youngest is now 32 years old.   Family History  Problem Relation Age of Onset  . Breast cancer Mother   . Cancer Mother 70       breast  . Pancreatic cancer Father   . Cancer Father        pancreatic  . Hearing loss Father   . Lymphoma Maternal Grandfather   . Cancer Maternal Grandfather        lymphoma  . Alcohol abuse Brother   . Diabetes Maternal Grandmother   . Hearing loss Brother   . Learning disabilities Brother   . Depression Brother   . Depression Brother   . Diabetes Sister       Review of Systems  All other systems reviewed and are negative.      Objective:   Physical Exam  Constitutional: She is oriented to person, place, and time. She appears well-developed and well-nourished. No distress.  HENT:  Head: Normocephalic and atraumatic.  Right Ear: External ear normal.  Left Ear: External ear normal.  Nose: Nose normal.  Mouth/Throat: Oropharynx is clear and moist. No oropharyngeal exudate.  Eyes: Conjunctivae and EOM  are normal. Pupils are equal, round, and reactive to light. Right eye exhibits no discharge. Left eye exhibits no discharge. No scleral icterus.  Neck: Normal range of motion. Neck supple. No JVD present. No tracheal deviation present. No thyromegaly present.  Cardiovascular: Normal rate, regular rhythm, normal heart sounds and intact distal pulses. Exam reveals no gallop and no friction rub.  No murmur heard. Pulmonary/Chest: Effort normal and breath sounds normal. No stridor. No respiratory distress. She has no wheezes. She has no rales. She exhibits no tenderness.  Abdominal: Soft. Bowel sounds are normal. She exhibits no distension and no mass. There is no tenderness. There is no rebound and no guarding.  Genitourinary: Vagina normal and uterus normal. Rectal exam shows external hemorrhoid. Cervix exhibits no motion tenderness and no friability. Right adnexum  displays no mass and no tenderness. Left adnexum displays no mass and no tenderness.  Musculoskeletal: Normal range of motion. She exhibits no edema, tenderness or deformity.  Lymphadenopathy:    She has no cervical adenopathy.  Neurological: She is alert and oriented to person, place, and time. She has normal reflexes. She displays normal reflexes. No cranial nerve deficit. She exhibits normal muscle tone. Coordination normal.  Skin: Skin is warm. No rash noted. She is not diaphoretic. No erythema. No pallor.  Psychiatric: She has a normal mood and affect. Her behavior is normal. Judgment and thought content normal.  Vitals reviewed.         Assessment & Plan:  General medical exam - Plan: CBC with Differential/Platelet, COMPLETE METABOLIC PANEL WITH GFR, Lipid panel, TSH  Hypothyroidism, unspecified type - Plan: TSH  Patient declines HIV and hepatitis C screening.  Immunizations are up-to-date except for the shingles vaccine.  Discussed the shingles vaccine and she can check on the price.  Check CBC, CMP, fasting lipid panel, and TSH.  Pap smear was sent to pathology today in a labeled container.  Pelvic exam is normal.  Consult GI for a colonoscopy.  Patient has large external hemorrhoids and a ring around the rectum with prolapsed rectal mucosa.  I believe she would benefit from seeing a general surgeon to discuss treatment options.

## 2018-02-19 ENCOUNTER — Other Ambulatory Visit: Payer: 59

## 2018-02-19 DIAGNOSIS — E039 Hypothyroidism, unspecified: Secondary | ICD-10-CM | POA: Diagnosis not present

## 2018-02-19 DIAGNOSIS — Z0000 Encounter for general adult medical examination without abnormal findings: Secondary | ICD-10-CM | POA: Diagnosis not present

## 2018-02-20 ENCOUNTER — Other Ambulatory Visit: Payer: Self-pay | Admitting: Family Medicine

## 2018-02-20 ENCOUNTER — Encounter: Payer: Self-pay | Admitting: Family Medicine

## 2018-02-20 LAB — LIPID PANEL
CHOL/HDL RATIO: 2.9 (calc) (ref <5.0)
CHOLESTEROL: 157 mg/dL (ref <200)
HDL: 54 mg/dL (ref >50)
LDL CHOLESTEROL (CALC): 88 mg/dL
Non-HDL Cholesterol (Calc): 103 mg/dL (calc) (ref <130)
Triglycerides: 66 mg/dL (ref <150)

## 2018-02-20 LAB — COMPLETE METABOLIC PANEL WITH GFR
AG Ratio: 1.8 (calc) (ref 1.0–2.5)
ALBUMIN MSPROF: 4.2 g/dL (ref 3.6–5.1)
ALKALINE PHOSPHATASE (APISO): 61 U/L (ref 33–130)
ALT: 22 U/L (ref 6–29)
AST: 25 U/L (ref 10–35)
BUN: 19 mg/dL (ref 7–25)
CO2: 25 mmol/L (ref 20–32)
CREATININE: 0.84 mg/dL (ref 0.50–1.05)
Calcium: 9.6 mg/dL (ref 8.6–10.4)
Chloride: 106 mmol/L (ref 98–110)
GFR, Est African American: 89 mL/min/{1.73_m2} (ref >=60)
GFR, Est Non African American: 77 mL/min/{1.73_m2} (ref >=60)
GLOBULIN: 2.4 g/dL (ref 1.9–3.7)
GLUCOSE: 66 mg/dL (ref 65–99)
Potassium: 4.2 mmol/L (ref 3.5–5.3)
SODIUM: 140 mmol/L (ref 135–146)
Total Bilirubin: 0.7 mg/dL (ref 0.2–1.2)
Total Protein: 6.6 g/dL (ref 6.1–8.1)

## 2018-02-20 LAB — CBC WITH DIFFERENTIAL/PLATELET
Basophils Absolute: 50 cells/uL (ref 0–200)
Basophils Relative: 1.1 %
EOS PCT: 8.2 %
Eosinophils Absolute: 369 cells/uL (ref 15–500)
HEMATOCRIT: 42.7 % (ref 35.0–45.0)
HEMOGLOBIN: 14.4 g/dL (ref 11.7–15.5)
LYMPHS ABS: 1368 {cells}/uL (ref 850–3900)
MCH: 29.8 pg (ref 27.0–33.0)
MCHC: 33.7 g/dL (ref 32.0–36.0)
MCV: 88.4 fL (ref 80.0–100.0)
MONOS PCT: 12.9 %
MPV: 12 fL (ref 7.5–12.5)
NEUTROS ABS: 2133 {cells}/uL (ref 1500–7800)
Neutrophils Relative %: 47.4 %
Platelets: 179 10*3/uL (ref 140–400)
RBC: 4.83 10*6/uL (ref 3.80–5.10)
RDW: 12.7 % (ref 11.0–15.0)
Total Lymphocyte: 30.4 %
WBC mixed population: 581 cells/uL (ref 200–950)
WBC: 4.5 10*3/uL (ref 3.8–10.8)

## 2018-02-20 LAB — PAP, TP IMAGING W/ HPV RNA, RFLX HPV TYPE 16,18/45: HPV DNA HIGH RISK: NOT DETECTED

## 2018-02-20 LAB — TSH: TSH: 0.05 mIU/L — ABNORMAL LOW (ref 0.40–4.50)

## 2018-02-20 MED ORDER — LEVOTHYROXINE SODIUM 88 MCG PO TABS: 88.0000 ug | ORAL_TABLET | Freq: Every day | ORAL | 3 refills | Status: DC

## 2018-02-20 MED FILL — LEVOTHYROXINE 88 MCG TABLET: 88 | 90 days supply | Qty: 90 | Fill #0

## 2018-02-21 ENCOUNTER — Encounter: Payer: Self-pay | Admitting: Family Medicine

## 2018-02-27 ENCOUNTER — Ambulatory Visit: Payer: 59 | Admitting: General Surgery

## 2018-03-03 DIAGNOSIS — E049 Nontoxic goiter, unspecified: Secondary | ICD-10-CM | POA: Diagnosis not present

## 2018-03-03 DIAGNOSIS — E039 Hypothyroidism, unspecified: Secondary | ICD-10-CM | POA: Diagnosis not present

## 2018-03-04 ENCOUNTER — Other Ambulatory Visit: Payer: Self-pay | Admitting: Endocrinology

## 2018-03-04 DIAGNOSIS — E049 Nontoxic goiter, unspecified: Secondary | ICD-10-CM

## 2018-03-06 ENCOUNTER — Ambulatory Visit: Payer: 59 | Admitting: General Surgery

## 2018-03-06 ENCOUNTER — Encounter: Payer: Self-pay | Admitting: General Surgery

## 2018-03-06 VITALS — BP 141/74 | HR 63 | Temp 98.6°F | Resp 18 | Ht 67.0 in | Wt 174.0 lb

## 2018-03-06 DIAGNOSIS — N816 Rectocele: Secondary | ICD-10-CM

## 2018-03-06 DIAGNOSIS — K643 Fourth degree hemorrhoids: Secondary | ICD-10-CM | POA: Diagnosis not present

## 2018-03-06 NOTE — Patient Instructions (Signed)
Hemorrhoids Hemorrhoids are swollen veins in and around the rectum or anus. There are two types of hemorrhoids:  Internal hemorrhoids. These occur in the veins that are just inside the rectum. They may poke through to the outside and become irritated and painful.  External hemorrhoids. These occur in the veins that are outside of the anus and can be felt as a painful swelling or hard lump near the anus.  Most hemorrhoids do not cause serious problems, and they can be managed with home treatments such as diet and lifestyle changes. If home treatments do not help your symptoms, procedures can be done to shrink or remove the hemorrhoids. What are the causes? This condition is caused by increased pressure in the anal area. This pressure may result from various things, including:  Constipation.  Straining to have a bowel movement.  Diarrhea.  Pregnancy.  Obesity.  Sitting for long periods of time.  Heavy lifting or other activity that causes you to strain.  Anal sex.  What are the signs or symptoms? Symptoms of this condition include:  Pain.  Anal itching or irritation.  Rectal bleeding.  Leakage of stool (feces).  Anal swelling.  One or more lumps around the anus.  How is this diagnosed? This condition can often be diagnosed through a visual exam. Other exams or tests may also be done, such as:  Examination of the rectal area with a gloved hand (digital rectal exam).  Examination of the anal canal using a small tube (anoscope).  A blood test, if you have lost a significant amount of blood.  A test to look inside the colon (sigmoidoscopy or colonoscopy).  How is this treated? This condition can usually be treated at home. However, various procedures may be done if dietary changes, lifestyle changes, and other home treatments do not help your symptoms. These procedures can help make the hemorrhoids smaller or remove them completely. Some of these procedures involve  surgery, and others do not. Common procedures include:  Rubber band ligation. Rubber bands are placed at the base of the hemorrhoids to cut off the blood supply to them.  Sclerotherapy. Medicine is injected into the hemorrhoids to shrink them.  Infrared coagulation. A type of light energy is used to get rid of the hemorrhoids.  Hemorrhoidectomy surgery. The hemorrhoids are surgically removed, and the veins that supply them are tied off.  Stapled hemorrhoidopexy surgery. A circular stapling device is used to remove the hemorrhoids and use staples to cut off the blood supply to them.  Follow these instructions at home: Eating and drinking  Eat foods that have a lot of fiber in them, such as whole grains, beans, nuts, fruits, and vegetables. Ask your health care provider about taking products that have added fiber (fiber supplements).  Drink enough fluid to keep your urine clear or pale yellow. Managing pain and swelling  Take warm sitz baths for 20 minutes, 3-4 times a day to ease pain and discomfort.  If directed, apply ice to the affected area. Using ice packs between sitz baths may be helpful. ? Put ice in a plastic bag. ? Place a towel between your skin and the bag. ? Leave the ice on for 20 minutes, 2-3 times a day. General instructions  Take over-the-counter and prescription medicines only as told by your health care provider.  Use medicated creams or suppositories as told.  Exercise regularly.  Go to the bathroom when you have the urge to have a bowel movement. Do not wait.    Avoid straining to have bowel movements.  Keep the anal area dry and clean. Use wet toilet paper or moist towelettes after a bowel movement.  Do not sit on the toilet for long periods of time. This increases blood pooling and pain. Contact a health care provider if:  You have increasing pain and swelling that are not controlled by treatment or medicine.  You have uncontrolled bleeding.  You  have difficulty having a bowel movement, or you are unable to have a bowel movement.  You have pain or inflammation outside the area of the hemorrhoids. This information is not intended to replace advice given to you by your health care provider. Make sure you discuss any questions you have with your health care provider. Document Released: 11/16/2000 Document Revised: 04/18/2016 Document Reviewed: 08/03/2015  About Rectocele  Overview  A rectocele is a type of hernia which causes different degrees of bulging of the rectal tissues into the vaginal wall.  You may even notice that it presses against the vaginal wall so much that some vaginal tissues droop outside of the opening of your vagina.  Causes of Rectocele  The most common cause is childbirth.  The muscles and ligaments in the pelvis that hold up and support the female organs and vagina become stretched and weakened during labor and delivery.  The more babies you have, the more the support tissues are stretched and weakened.  Not everyone who has a baby will develop a rectocele.  Some women have stronger supporting tissue in the pelvis and may not have as much of a problem as others.  Women who have a Cesarean section usually do not get rectocele's unless they pushed a long time prior to the cesarean delivery.  Other conditions that can cause a rectocele include chronic constipation, a chronic cough, a lot of heavy lifting, and obesity.  Older women may have this problem because the loss of female hormones causes the vaginal tissue to become weaker.  Symptoms  There may not be any symptoms.  If you do have symptoms, they may include:  Pelvic pressure in the rectal area  Protrusion of the lower part of the vagina through the opening of the vagina  Constipation and trapping of the stool, making it difficult to have a bowel movement.  In severe cases, you may have to press on the lower part of your vagina to help push the stool out of  you rectum.  This is called splinting to empty.  Diagnosing Rectocele  Your health care provider will ask about your symptoms and perform a pelvic exam.  S/he will ask you to bear down, pushing like you are having a bowel movement so as to see how far the lower part of the vagina protrudes into the vagina and possible outside of the vagina.  Your provider will also ask you to contract the muscles of your pelvis (like you are stopping the stream in the middle of urinating) to determine the strength of your pelvic muscles.  Your provider may also do a rectal exam.  Treatment Options  If you do not have any symptoms, no treatment may be necessary.  Other treatment options include:  Pelvic floor exercises: Contracting the muscles in your genital area may help strengthen your muscles and support the organs.  Be sure to get proper exercise instruction from you physical therapist.  A pessary (removealbe pelvic support device) sometimes helps rectocele symptoms.  Surgery: Surgical repair may be necessary. In some cases the uterus  may need to be taken out ( a hysterectomy) as well.  There are many types of surgery for pelvic support problems.  Look for physicians who specialize in repair procedures.  You can take care of yourself by:  Treating and preventing constipation  Avoiding heavy lifting, and lifting correctly (with your legs, not with you waist or back)  Treating a chronic cough or bronchitis  Not smoking  avoiding too much weight gain  Doing pelvic floor exercises   2007, [employer] Doc.33 Elsevier Interactive Patient Education  2018 [place].   Pelvic Floor Exercises for Bowel Control  Exercises using both the external anal sphincter and the deep pelvic floor muscles can help you to improve your bowel control. When done correctly, these exercises can tone and strengthen the muscles to help you hold back gas and prevent fecal incontinence (leakage of stool).  Exercise programs take time; you may not see any noticeable change in your bowel control immediately.  In some cases it may take several months to regain control.  Bowel Control Muscles The anus and the anal canal, has rings of muscle around it. The outer ring of muscle is called the external anal sphincter; it is a voluntary muscle which you can learn to tighten and close more efficiently. When you contract it you will feel the skin around your anus tighten and pull in as if the anus is winking. Try to keep the buttocks muscles relaxed. The inner ring around the anus is the internal anal sphincter. It is an involuntary and automatic muscle; you don't have to think to keep it closed or open.  This muscle should be closed at all times, except when you are actually trying to have a bowel movement.  In addition to the sphincter muscles, there are deeper muscles called the levator ani that form a sling from your tailbone to your pubic bone. The levator ani muscle has a specific part called the puborectalis that holds stool in until you give the signal to relax and empty.  When you contract these muscles it creates a feeling of lifting the anus inward.  External Anal Sphincter        Levator ani deep layer   Effective Exercises for Control of Gas and Bowels . Identify the specific areas of the pelvic floor muscles you need to use.  This can be done using a mirror to see if you are contracting the correct muscles or by placing the pad of your finger at or just inside the anal opening. . Develop an exercise plan for strength, endurance and quick response of the muscles and stick with it.  You must make the muscles do more than they are used to doing. . Incorporate the exercises into your daily activities.   2007, [employer] Doc.36    The Female Pelvic Floor  The pelvic floor consists of several layers of muscles that cover the bottom of the pelvic cavity. These muscles have several  distinct roles:  1. To support the pelvic organs, the bladder, uterus and colon within the pelvis. 2. To assist in stopping and starting the flow of urine or the passage of gas or stool.To aid in sexual appreciation.  How to Locate the Pelvic Floor Muscles  The Urine Stop Test . At the midstream of your urine flow, squeeze the pelvic floor muscles. You should feel the sensation of the openings close and the muscles pulling up and into the pelvic cavity.  If you have strong muscles  you will slow or stop the stream of urine. . Stop or slow the flow of urine without tensing the muscles of your legs, buttocks. . Do this only to locate the muscles, not as a daily exercise. Feeling the Muscle . Place a fingertip on the anal opening. Contract and lift the muscles as though you are holding back gas or stool. You will feel your anal opening tighten. . Insert 1 or 2 fingers into the vagina to feel the contraction and lifting of the muscles. You should feel the opening of the vagina tighten around your finger. Watching the Muscle Contract . Begin by lying on a flat surface.  Position yourself with your knees apart and bent with your head elevated and supported on several pillows. Use a mirror to look at the anal and vaginal openings and the perineal body (the area between the two openings).  . Contract or tighten the muscles around the openings and watch for a lifting of the perineal body and closure of the openings.   . If you see a bulge or feel tissues coming out of your openings, this is an incorrect contraction and you should notify your health care provider for more instructions.  2007, [employer] Doc.11               Rectal Prolapse, Adult Rectal prolapse happens when the inside of the final section of the large intestine (rectum) pushes out through the anal opening. With this condition, the lower part of the rectum turns inside out. At first, rectal prolapse may be  temporary. It may happen only when you are having a bowel movement. Over time, the prolapse will likely get worse. It may start to happen more often and cause uncomfortable symptoms. Eventually, the prolapse may happen when you are walking or simply standing. Surgery is often needed for this condition. What are the causes? This condition may result from weakness of the muscles that attach the rectum to the inside of the lower abdomen. The exact cause of this muscle weakness is not known. What increases the risk? This condition is more likely to develop in:  Women who are 57 years of age or older.  People with a history of constipation.  People with a history of hemorrhoids.  People who have a lower spinal cord injury.  Women who have been pregnant many times.  People who have had rectal surgery.  Men who have an enlarged prostate gland.  People who have chronic obstructive pulmonary disease (COPD).  People who have cystic fibrosis.  What are the signs or symptoms? The main symptom of this condition is a red bump of tissue sticking out from your anus. At first, the bump may only appear after a bowel movement. It may then start to appear more often. Other symptoms may include:  Discomfort in the anus and rectum.  Constipation.  Diarrhea.  Inability to control bowel movements (incontinence).  Rectal bleeding.  How is this diagnosed? This condition may be diagnosed based on your symptoms and a physical exam. During the exam, you may be asked to squat and strain as though you are having a bowel movement. You may also have tests, such as:  A rectal exam using a flexible scope (sigmoidoscopy or colonoscopy).  A procedure that involves taking X-rays of your rectum after a dye (contrast material) is injected into the rectum (defecogram).  How is this treated? This condition is usually treated with surgery to repair the weakened muscles and to reconnect  the rectum to attachments  inside the lower abdomen. Other treatment options may include:  Pushing the prolapsed area back into the rectum (reduction). Your health care provider may do this by gently pushing it back in using a moist cloth. The health care provider may also show you how to do this at home if the prolapse occurs again.  Medicines to prevent constipation and straining. This may include laxatives or stool softeners.  Follow these instructions at home: General instructions  Take over-the-counter and prescription medicines only as told by your health care provider.  Do not strain to have a bowel movement.  Do not lift anything that is heavier than 10 lb (4.5 kg).  Follow instructions from your health care provider about what to do if the prolapse occurs again and does not go back in. This may involve lying on your side and using a moist cloth to gently press the lump into your rectum.  Keep all follow-up visits as told by your health care provider. This is important. Preventing Constipation  Eat foods that have a lot of fiber, such as fruits, vegetables, whole grains, and beans.  Limit foods high in fat and processed sugars, such as french fries, hamburgers, cookies, candies, and soda.  Drink enough fluids to keep your urine clear or pale yellow. Contact a health care provider if:  You have a fever.  Your prolapse cannot be reduced at home.  You have constipation or diarrhea.  You have mild rectal bleeding. Get help right away if:  You have very bad rectal pain.  You bleed heavily from your rectum. This information is not intended to replace advice given to you by your health care provider. Make sure you discuss any questions you have with your health care provider. Document Released: 08/10/2015 Document Revised: 04/26/2016 Document Reviewed: 12/08/2014 Elsevier Interactive Patient Education  2018 [place].   Pelvic Organ Prolapse Pelvic organ prolapse is the stretching, bulging, or  dropping of pelvic organs into an abnormal position. It happens when the muscles and tissues that surround and support pelvic structures are stretched or weak. Pelvic organ prolapse can involve:  Vagina (vaginal prolapse).  Uterus (uterine prolapse).  Bladder (cystocele).  Rectum (rectocele).  Intestines (enterocele).  When organs other than the vagina are involved, they often bulge into the vagina or protrude from the vagina, depending on how severe the prolapse is. What are the causes? Causes of this condition include:  Pregnancy, labor, and childbirth.  Long-lasting (chronic) cough.  Chronic constipation.  Obesity.  Past pelvic surgery.  Aging. During and after menopause, a decreased production of the hormone estrogen can weaken pelvic ligaments and muscles.  Consistently lifting more than 50 lb (23 kg).  Buildup of fluid in the abdomen due to certain diseases and other conditions.  What are the signs or symptoms? Symptoms of this condition include:  Loss of bladder control when you cough, sneeze, strain, and exercise (stress incontinence). This may be worse immediately following childbirth, and it may gradually improve over time.  Feeling pressure in your pelvis or vagina. This pressure may increase when you cough or when you are having a bowel movement.  A bulge that protrudes from the opening of your vagina or against your vaginal wall. If your uterus protrudes through the opening of your vagina and rubs against your clothing, you may also experience soreness, ulcers, infection, pain, and bleeding.  Increased effort to have a bowel movement or urinate.  Pain in your low back.  Pain,  discomfort, or disinterest in sexual intercourse.  Repeated bladder infections (urinary tract infections).  Difficulty inserting or inability to insert a tampon or applicator.  In some people, this condition does not cause any symptoms. How is this diagnosed? Your health care  provider may perform an internal and external vaginal and rectal exam. During the exam, you may be asked to cough and strain while you are lying down, sitting, and standing up. Your health care provider will determine if other tests are required, such as bladder function tests. How is this treated? In most cases, this condition needs to be treated only if it produces symptoms. No treatment is guaranteed to correct the prolapse or relieve the symptoms completely. Treatment may include:  Lifestyle changes, such as: ? Avoiding drinking beverages that contain caffeine. ? Increasing your intake of high-fiber foods. This can help to decrease constipation and straining during bowel movements. ? Emptying your bladder at scheduled times (bladder training therapy). This can help to reduce or avoid urinary incontinence. ? Losing weight if you are overweight or obese.  Estrogen. Estrogen may help mild prolapse by increasing the strength and tone of pelvic floor muscles.  Kegel exercises. These may help mild cases of prolapse by strengthening and tightening the muscles of the pelvic floor.  Pessary insertion. A pessary is a soft, flexible device that is placed into your vagina by your health care provider to help support the vaginal walls and keep pelvic organs in place.  Surgery. This is often the only form of treatment for severe prolapse. Different types of surgeries are available.  Follow these instructions at home:  Wear a sanitary pad or absorbent product if you have urinary incontinence.  Avoid heavy lifting and straining with exercise and work. Do not hold your breath when you perform mild to moderate lifting and exercise activities. Limit your activities as directed by your health care provider.  Take medicines only as directed by your health care provider.  Perform Kegel exercises as directed by your health care provider.  If you have a pessary, take care of it as directed by your health  care provider. Contact a health care provider if:  Your symptoms interfere with your daily activities or sex life.  You need medicine to help with the discomfort.  You notice bleeding from the vagina that is not related to your period.  You have a fever.  You have pain or bleeding when you urinate.  You have bleeding when you have a bowel movement.  You lose urine when you have sex.  You have chronic constipation.  You have a pessary that falls out.  You have vaginal discharge that has a bad smell.  You have low abdominal pain or cramping that is unusual for you. This information is not intended to replace advice given to you by your health care provider. Make sure you discuss any questions you have with your health care provider. Document Released: 06/16/2014 Document Revised: 04/26/2016 Document Reviewed: 02/01/2014 Elsevier Interactive Patient Education  [employer].

## 2018-03-10 NOTE — Progress Notes (Signed)
[facility] History and Physical  Reason for Referral: Hemorrhoids ? Rectal Prolapse  Referring Physician: Dr. Dennard Sampson   Chief Complaint    Hemorrhoids      Amanda Sampson is a 58 y.o. female.  HPI: Amanda Sampson is a 57 yo who comes in with concerns for hemorrhoids versus rectal prolapse. She is G5P5 and had all the children vaginally with episiotomies. Her children averaged about 9 lbs each.  She reports having hemorrhoids since her children were born and that they have never really caused her much concern or issues other than having to wear a pad for some bleeding and discharge.  She reports minimal to no discomfort, and says they are only very uncomfortable if she has had an episode of constipation.  Mostly she is regular and has soft daily stools, and does have to take some colace to make sure that this occurs.  She reports that she has no symptoms of fecal leakage or incontinence.  She says that she has tissue that remains out and that this tissue does not go back in spontaneously or with her pushing it back in for the most part.    She was seen by Dr. Dennard Sampson for her annual examination, and he felt that she could have a component of prolapsed based on the exam that he performed.  She has never had a colonoscopy but is referred for a colonoscopy. She has no family history of any colon cancer.   Dr. Dennard Sampson does her [place] and pap smears.  She is up to date on her Mammograms and these have been benign.    Past Medical History:  Diagnosis Date  . Goiter    hypothyroid    History reviewed. No pertinent surgical history.  Family History  Problem Relation Age of Onset  . Breast cancer Mother   . Cancer Mother 54       breast  . Pancreatic cancer Father   . Cancer Father        pancreatic  . Hearing loss Father   . Lymphoma Maternal Grandfather   . Cancer Maternal Grandfather        lymphoma  . Alcohol abuse Brother   . Diabetes Maternal Grandmother   .  Hearing loss Brother   . Learning disabilities Brother   . Depression Brother   . Depression Brother   . Diabetes Sister     Social History   Tobacco Use  . Smoking status: Never Smoker  . Smokeless tobacco: Never Used  Substance Use Topics  . Alcohol use: No    Alcohol/week: 0.0 oz  . Drug use: No    Medications: I have reviewed the patient's current medications. Allergies as of 03/06/2018   No Known Allergies     Medication List        Accurate as of 03/06/18 11:59 PM. Always use your most recent med list.          ibuprofen 200 MG tablet Commonly known as:  ADVIL,MOTRIN Take 600 mg by mouth every 6 (six) hours as needed for pain.   levothyroxine 88 MCG tablet Commonly known as:  SYNTHROID, LEVOTHROID Take 1 tablet (88 mcg total) by mouth daily.   multivitamin tablet Take 1 tablet by mouth daily.        ROS:  A comprehensive review of systems was negative except for: Gastrointestinal: positive for bloody BMs/ discharge; feeling like cannot empty rectum Genitourinary: positive for strain with urination Allergic/Immunologic: positive for hay  fever  Blood pressure (!) 141/74, pulse 63, temperature 98.6 F (37 C), resp. rate 18, height 5\' 7"  (1.702 m), weight 174 lb (78.9 kg). Physical Exam  Constitutional: She is oriented to person, place, and time and well-developed, well-nourished, and in no distress.  HENT:  Head: Normocephalic.  Eyes: Pupils are equal, round, and reactive to light.  Neck: Normal range of motion.  Cardiovascular: Normal rate and regular rhythm.  Pulmonary/Chest: Effort normal and breath sounds normal.  Abdominal: Soft. She exhibits no distension. There is no tenderness.  Genitourinary: Rectal exam shows external hemorrhoid and internal hemorrhoid. Rectal exam shows no fissure, no mass and no tenderness.  Genitourinary Comments: Large three column hemorrhoids, grade IV with large skin tags associated, separate columns noted and NO  concentric rings of mucosa noted; valsalva performed and NO concentric rings noted, signs of bleeding from the prolapsed hemorrhoidal tissue, no gross blood on digital exam and no masses; rectocele noted between vagina and rectum; vagina with valsalva possible cervix noted in the introitus   Musculoskeletal: Normal range of motion. She exhibits no edema.  Neurological: She is alert and oriented to person, place, and time.  Skin: Skin is warm and dry.  Psychiatric: Mood, memory, affect and judgment normal.  Vitals reviewed.   Results: None   Assessment & Plan:  Amanda Sampson is a 57 y.o. female with what appears to be large prolapsing chronic grade IV hemorrhoids with extra hemorrhoidal skin tags external. With all valsalva maneuvers there is no concentric mucosa noted.   I definitely think the patient is at risk for and has some pelvic floor dysfunction, and that she is at risk for developing rectal prolapse in the future but at this time on clinical exam, she does not have any signs of prolapse and has no incontinence or other associated symptoms.  We have discussed pelvic floor dysfunction, and the very specialized care of this disorder with colorectal surgeons and uro-gynocologist.  She does have a rectocele on exam, and has some symptoms that go along with uterine/ bladder prolapse with the bulge in her perineum and the tissue at the introitus with valsalva and the urinary strain.   Discussed that she is at risk in the future for rectal prolapse, and that pelvic floor dysfunction is likely.  The workup would include defecography and manometry, and physical therapy can be utilized to aid patient once they are having issues.  Also discussed that prolapse can be surgically treated if this occurs, and that typically a specially trained colorectal surgeon would be the best person to see due to the complexity of the pelvic floor.   As far as the Gyn / Urinary issues, I discussed with her that a  Uro-gynocologist would be the person to see regarding these types of problems.    At this time, she has hemorrhoids on exam and minor complaints.   Hemorrhoid surgery for large internal/ external hemorrhoids is very painful. The pain and discomfort that the patient is having currently will be magnified after the surgery for at least 2-3 weeks.  The patient will have feelings of constant pressure and pain in the area from the swelling and removal of the anoderm (skin around the anus). The internal hemorrhoids are not painful to remove because the same nerves are not involved, and the sensation is different, but removal of any external hemorrhoids will cause significant discomfort. They will need at least 4-6 weeks to recover from the surgery, and should not expect to be  able to feel back to "normal for 6-8 weeks."    The risk of hemorrhoid surgery include bleeding, risk of infection although rare, and the risk of narrowing the anal canal if too much tissue is removed. Given this risk, it is likely that only the 2 largest hemorrhoid columns would be removed during the initial surgery.  We have also discussed the risk of incontinence after surgery if the muscles were injured, and although this is rare that it can happen and is another reason to limit the amount of hemorrhoids removed.    She does not really want to get surgery for them as she has lived with them for so long.   I have given her literature on pelvic floor dysfunction and have shown her pictures of the difference of prolapse versus hemorrhoids, and have offered referral to colorectal if she thinks she wants to pursue further workup.  At this time, she is going to read over things and think about what she wants to do.    She can also be referred to uro-gyn in the future for any additional vaginal/ bladder complaints.   All questions were answered to the satisfaction of the patient.  Amanda Sampson 03/10/2018, 8:00 AM

## 2018-03-24 ENCOUNTER — Ambulatory Visit
Admission: RE | Admit: 2018-03-24 | Discharge: 2018-03-24 | Disposition: A | Discharge status: Home / Self Care | Payer: 59 | Source: Ambulatory Visit | Attending: Endocrinology | Admitting: Endocrinology

## 2018-03-24 DIAGNOSIS — E042 Nontoxic multinodular goiter: Secondary | ICD-10-CM | POA: Diagnosis not present

## 2018-03-24 DIAGNOSIS — E049 Nontoxic goiter, unspecified: Secondary | ICD-10-CM

## 2018-04-01 ENCOUNTER — Encounter: Payer: Self-pay | Admitting: Internal Medicine

## 2018-05-05 DIAGNOSIS — E039 Hypothyroidism, unspecified: Secondary | ICD-10-CM | POA: Diagnosis not present

## 2018-05-19 ENCOUNTER — Ambulatory Visit (AMBULATORY_SURGERY_CENTER): Payer: Self-pay | Admitting: *Deleted

## 2018-05-19 ENCOUNTER — Other Ambulatory Visit: Payer: Self-pay

## 2018-05-19 VITALS — Ht 67.0 in | Wt 169.0 lb

## 2018-05-19 DIAGNOSIS — Z1211 Encounter for screening for malignant neoplasm of colon: Secondary | ICD-10-CM

## 2018-05-19 NOTE — Progress Notes (Signed)
Patient denies any allergies to eggs or soy. Patient denies any problems with anesthesia/sedation. Patient denies any oxygen use at home. Patient denies taking any diet/weight loss medications or blood thinners. EMMI education assisgned to patient on colonoscopy, this was explained and instructions given to patient. 

## 2018-05-20 ENCOUNTER — Encounter: Payer: Self-pay | Admitting: Internal Medicine

## 2018-05-28 MED FILL — LEVOTHYROXINE 88 MCG TABLET: 88 | 90 days supply | Qty: 90 | Fill #1

## 2018-06-03 ENCOUNTER — Other Ambulatory Visit: Payer: Self-pay

## 2018-06-03 ENCOUNTER — Encounter: Payer: Self-pay | Admitting: Internal Medicine

## 2018-06-03 ENCOUNTER — Ambulatory Visit (AMBULATORY_SURGERY_CENTER): Payer: 59 | Admitting: Internal Medicine

## 2018-06-03 VITALS — BP 135/76 | HR 52 | Temp 98.4°F | Resp 16 | Ht 67.0 in | Wt 174.0 lb

## 2018-06-03 DIAGNOSIS — D124 Benign neoplasm of descending colon: Secondary | ICD-10-CM

## 2018-06-03 DIAGNOSIS — D120 Benign neoplasm of cecum: Secondary | ICD-10-CM | POA: Diagnosis not present

## 2018-06-03 DIAGNOSIS — Z1211 Encounter for screening for malignant neoplasm of colon: Secondary | ICD-10-CM

## 2018-06-03 DIAGNOSIS — Z86010 Personal history of colonic polyps: Secondary | ICD-10-CM | POA: Insufficient documentation

## 2018-06-03 DIAGNOSIS — E039 Hypothyroidism, unspecified: Secondary | ICD-10-CM | POA: Diagnosis not present

## 2018-06-03 DIAGNOSIS — K635 Polyp of colon: Secondary | ICD-10-CM | POA: Diagnosis not present

## 2018-06-03 DIAGNOSIS — K644 Residual hemorrhoidal skin tags: Secondary | ICD-10-CM | POA: Insufficient documentation

## 2018-06-03 HISTORY — DX: Residual hemorrhoidal skin tags: K64.4

## 2018-06-03 MED ORDER — SODIUM CHLORIDE 0.9 % IV SOLN: 500.0000 mL | Freq: Once | INTRAVENOUS | Status: DC

## 2018-06-03 NOTE — Patient Instructions (Addendum)
I found and removed 2 tiny polyps that look benign. I will let you know pathology results and when to have another routine colonoscopy by mail and/or My Chart.  The hemorrhoids are mostly external and would require surgery to repair.  I appreciate the opportunity to care for you. Gatha Mayer, MD, [facility]  Handouts given: Polyps and Hemorrhoids  YOU HAD AN ENDOSCOPIC PROCEDURE TODAY AT [facility]:   Refer to the procedure report that was given to you for any specific questions about what was found during the examination.  If the procedure report does not answer your questions, please call your gastroenterologist to clarify.  If you requested that your care partner not be given the details of your procedure findings, then the procedure report has been included in a sealed envelope for you to review at your convenience later.  YOU SHOULD EXPECT: Some feelings of bloating in the abdomen. Passage of more gas than usual.  Walking can help get rid of the air that was put into your GI tract during the procedure and reduce the bloating. If you had a lower endoscopy (such as a colonoscopy or flexible sigmoidoscopy) you may notice spotting of blood in your stool or on the toilet paper. If you underwent a bowel prep for your procedure, you may not have a normal bowel movement for a few days.  Please Note:  You might notice some irritation and congestion in your nose or some drainage.  This is from the oxygen used during your procedure.  There is no need for concern and it should clear up in a day or so.  SYMPTOMS TO REPORT IMMEDIATELY:   Following lower endoscopy (colonoscopy or flexible sigmoidoscopy):  Excessive amounts of blood in the stool  Significant tenderness or worsening of abdominal pains  Swelling of the abdomen that is new, acute  Fever of 100F or higher   For urgent or emergent issues, a gastroenterologist can be reached at any hour by calling [phone]  [phone].   DIET:  We do recommend a small meal at first, but then you may proceed to your regular diet.  Drink plenty of fluids but you should avoid alcoholic beverages for 24 hours.  ACTIVITY:  You should plan to take it easy for the rest of today and you should NOT DRIVE or use heavy machinery until tomorrow (because of the sedation medicines used during the test).    FOLLOW UP: Our staff will call the number listed on your records the next business day following your procedure to check on you and address any questions or concerns that you may have regarding the information given to you following your procedure. If we do not reach you, we will leave a message.  However, if you are feeling well and you are not experiencing any problems, there is no need to return our call.  We will assume that you have returned to your regular daily activities without incident.  If any biopsies were taken you will be contacted by phone or by letter within the next 1-3 weeks.  Please call us at [phone] if you have not heard about the biopsies in 3 weeks.    SIGNATURES/CONFIDENTIALITY: You and/or your care partner have signed paperwork which will be entered into your electronic medical record.  These signatures attest to the fact that that the information above on your After Visit Summary has been reviewed and is understood.  Full responsibility of the confidentiality of this discharge  information lies with you and/or your care-partner. 

## 2018-06-03 NOTE — Progress Notes (Signed)
Called to room to assist during endoscopic procedure.  Patient ID and intended procedure confirmed with present staff. Received instructions for my participation in the procedure from the performing physician.  

## 2018-06-03 NOTE — Progress Notes (Signed)
Report to PACU, RN, vss, BBS= Clear.  

## 2018-06-03 NOTE — Op Note (Signed)
[place] Patient Name: Amanda Sampson Procedure Date: 06/03/2018 1:26 PM MRN: [id number] Endoscopist: Gatha Mayer , MD Age: 57 Referring MD:  Date of Birth: 02-09-1961 Gender: Female Account #: [id number] Procedure:                Colonoscopy Indications:              Screening for colorectal malignant neoplasm, This                            is the patient's first colonoscopy Medicines:                Propofol per Anesthesia, Monitored Anesthesia Care Procedure:                Pre-Anesthesia Assessment:                           - Prior to the procedure, a History and Physical                            was performed, and patient medications and                            allergies were reviewed. The patient's tolerance of                            previous anesthesia was also reviewed. The risks                            and benefits of the procedure and the sedation                            options and risks were discussed with the patient.                            All questions were answered, and informed consent                            was obtained. Prior Anticoagulants: The patient has                            taken no previous anticoagulant or antiplatelet                            agents. ASA Grade Assessment: II - A patient with                            mild systemic disease. After reviewing the risks                            and benefits, the patient was deemed in                            satisfactory condition to undergo the procedure.  After obtaining informed consent, the colonoscope                            was passed under direct vision. Throughout the                            procedure, the patient's blood pressure, pulse, and                            oxygen saturations were monitored continuously. The                            Colonoscope was introduced through the anus and   advanced to the the cecum, identified by                            appendiceal orifice and ileocecal valve. The                            colonoscopy was performed without difficulty. The                            patient tolerated the procedure well. The quality                            of the bowel preparation was adequate. The bowel                            preparation used was Miralax. The ileocecal valve,                            appendiceal orifice, and rectum were photographed. Scope In: 1:38:57 PM Scope Out: 1:56:55 PM Scope Withdrawal Time: 0 hours 12 minutes 11 seconds  Total Procedure Duration: 0 hours 17 minutes 58 seconds  Findings:                 The perianal exam findings include non-thrombosed                            external hemorrhoids and skin tags.                           Two flat and sessile polyps were found in the                            descending colon and ileocecal valve. The polyps                            were diminutive in size. These polyps were removed                            with a cold snare. Resection and retrieval were  complete. Verification of patient identification                            for the specimen was done. Estimated blood loss was                            minimal.                           Multiple diverticula were found in the sigmoid                            colon.                           The digital rectal exam findings include rectocele.                           External and internal hemorrhoids were found during                            retroflexion.                           The exam was otherwise without abnormality on                            direct and retroflexion views. Complications:            No immediate complications. Estimated Blood Loss:     Estimated blood loss was minimal. Impression:               - Non-thrombosed external hemorrhoids and perianal                             skin tags found on perianal exam.                           - Two diminutive polyps in the descending colon and                            at the ileocecal valve, removed with a cold snare.                            Resected and retrieved.                           - Diverticulosis in the sigmoid colon.                           - Rectocele. found on digital rectal exam.                           - External and internal hemorrhoids.                           - The examination was otherwise normal on  direct                            and retroflexion views. Recommendation:           - Patient has a contact number available for                            emergencies. The signs and symptoms of potential                            delayed complications were discussed with the                            patient. Return to normal activities tomorrow.                            Written discharge instructions were provided to the                            patient.                           - Resume previous diet.                           - Continue present medications.                           - Repeat colonoscopy is recommended. The                            colonoscopy date will be determined after pathology                            results from today's exam become available for                            review.                           - I think hemorrhoids would require surgery to                            repaior. Not appropriate for banding. Gatha Mayer, MD 06/03/2018 2:08:00 PM This report has been signed electronically.

## 2018-06-04 ENCOUNTER — Telehealth: Payer: Self-pay

## 2018-06-04 NOTE — Telephone Encounter (Signed)
Unable to leave message, voicemail not set up.

## 2018-06-04 NOTE — Telephone Encounter (Signed)
  Follow up Call-  Call back number 06/03/2018  Post procedure Call Back phone  # [phone]  Permission to leave phone message No  comments no answering machine set up  Some recent data might be hidden    Voicemail has not been set up.  Unable to leave message and pt did not consent to leaving message. [place]/Call-back [facility]

## 2018-06-09 ENCOUNTER — Encounter: Payer: Self-pay | Admitting: Internal Medicine

## 2018-06-09 NOTE — Progress Notes (Signed)
1 adenoma and 1 mucosal polyp Recall 2024 My Chart letter

## 2018-06-20 DIAGNOSIS — H524 Presbyopia: Secondary | ICD-10-CM | POA: Diagnosis not present

## 2018-08-25 MED FILL — LEVOTHYROXINE 88 MCG TABLET: 88 | 90 days supply | Qty: 90 | Fill #2

## 2018-11-24 MED FILL — LEVOTHYROXINE 88 MCG TABLET: 88 | 90 days supply | Qty: 90 | Fill #3

## 2018-12-26 ENCOUNTER — Encounter: Payer: 59 | Admitting: Family Medicine

## 2019-02-11 ENCOUNTER — Other Ambulatory Visit: Payer: Self-pay | Admitting: Family Medicine

## 2019-02-11 DIAGNOSIS — Z1231 Encounter for screening mammogram for malignant neoplasm of breast: Secondary | ICD-10-CM

## 2019-02-20 ENCOUNTER — Other Ambulatory Visit: Payer: Self-pay | Admitting: Family Medicine

## 2019-02-20 MED FILL — LEVOTHYROXINE 88 MCG TABLET: 88 | 30 days supply | Qty: 30 | Fill #0

## 2019-02-23 ENCOUNTER — Ambulatory Visit (INDEPENDENT_AMBULATORY_CARE_PROVIDER_SITE_OTHER): Payer: 59 | Admitting: Family Medicine

## 2019-02-23 DIAGNOSIS — J329 Chronic sinusitis, unspecified: Secondary | ICD-10-CM | POA: Diagnosis not present

## 2019-02-23 MED ORDER — LEVOCETIRIZINE DIHYDROCHLORIDE 5 MG PO TABS: 5.0000 mg | ORAL_TABLET | Freq: Every evening | ORAL | 0 refills | Status: DC

## 2019-02-23 MED ORDER — FLUTICASONE PROPIONATE 50 MCG/ACT NA SUSP: 2.0000 | Freq: Every day | NASAL | 6 refills | Status: DC

## 2019-02-23 MED FILL — LEVOCETIRIZINE 5 MG TABLET: 5 | 30 days supply | Qty: 30 | Fill #0

## 2019-02-23 MED FILL — FLUTICASONE PROP 50 MCG SPR: 50 | 30 days supply | Qty: 16 | Fill #0

## 2019-02-23 NOTE — Progress Notes (Signed)
Subjective:    Patient ID: Amanda Sampson, female    DOB: 12-Jan-1961, 58 y.o.   MRN: [id number]  HPI  Patient is seen today via tele-visit due to the recent coronavirus pandemic.  Phone call began at 250 in the afternoon.  Patient is currently shopping at her bank.  She is talking to me via her cell phone.  I am at my office.  Symptoms began 1 week ago with a low-grade temperature to 99.  She also had some pain and pressure in her right maxillary sinus.  She has not run a fever since however she continues to have rhinorrhea and head congestion.  She also continues to have drainage coming only from the right side.  There is also some pain and pressure in the right side.  She has not tried any medication over-the-counter.  She took 1 dose of Sudafed however was afraid to continue to take the medication because of her thyroid medication.  She denies any fevers or chills today.  She denies any pain in her right maxillary sinus or her other sinuses.  There is more marked consistent pressure and rhinorrhea.  She also reports postnasal drip.  She denies any cough.  She denies any shortness of breath.  She denies any chest pain.  She denies any recent travel Past Medical History:  Diagnosis Date  . Goiter    hypothyroid  . Hemorrhoids   . Prolapsed external hemorrhoids 06/03/2018  . Rectocele    Past Surgical History:  Procedure Laterality Date  . WISDOM TOOTH EXTRACTION     Current Outpatient Medications on File Prior to Visit  Medication Sig Dispense Refill  . ibuprofen (ADVIL,MOTRIN) 200 MG tablet Take 600 mg by mouth every 6 (six) hours as needed for pain.    Marland Kitchen levothyroxine (SYNTHROID, LEVOTHROID) 88 MCG tablet TAKE 1 TABLET (88 MCG TOTAL) BY MOUTH DAILY. 30 tablet 0  . Multiple Vitamin (MULTIVITAMIN) tablet Take 1 tablet by mouth daily.     Current Facility-Administered Medications on File Prior to Visit  Medication Dose Route Frequency Provider Last Rate Last Dose  . 0.9 %  sodium chloride  infusion  500 mL Intravenous Once Gatha Mayer, MD       No Known Allergies Social History   Socioeconomic History  . Marital status: Married    Spouse name: Not on file  . Number of children: Not on file  . Years of education: Not on file  . Highest education level: Not on file  Occupational History  . Not on file  Social Needs  . Financial resource strain: Not on file  . Food insecurity:    Worry: Not on file    Inability: Not on file  . Transportation needs:    Medical: Not on file    Non-medical: Not on file  Tobacco Use  . Smoking status: Never Smoker  . Smokeless tobacco: Never Used  Substance and Sexual Activity  . Alcohol use: No    Alcohol/week: 0.0 standard drinks  . Drug use: No  . Sexual activity: Never    Birth control/protection: None  Lifestyle  . Physical activity:    Days per week: Not on file    Minutes per session: Not on file  . Stress: Not on file  Relationships  . Social connections:    Talks on phone: Not on file    Gets together: Not on file    Attends religious service: Not on file    Active  member of club or organization: Not on file    Attends meetings of clubs or organizations: Not on file    Relationship status: Not on file  . Intimate partner violence:    Fear of current or ex partner: Not on file    Emotionally abused: Not on file    Physically abused: Not on file    Forced sexual activity: Not on file  Other Topics Concern  . 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   She is married.   She has 5 children. The youngest is now 49 years old.     Review of Systems  All other systems reviewed and are negative.      Objective:   Physical Exam No physical exam was performed as this was a tele-visit.       Assessment & Plan:  Rhinosinusitis  Patient symptoms sound consistent with a mild viral upper respiratory infection/rhinosinusitis.  I  do not believe this is bacterial sinus infection yet due to the lack of fever and pain.  Therefore I will treat the patient empirically with Xyzal 5 mg p.o. daily to help with drainage and postnasal drip and sinus congestion.  She can also use Flonase 2 sprays each nostril daily in case this is allergies causing the sinus pressure rather than a virus.  Await 3 to 4 days to see if symptoms improve.  If she develops fever chills or worsening pain in her right maxillary sinus, I would happily call out an antibiotic for a secondary sinus infection.  Phone visit was completed at 3:01

## 2019-02-26 ENCOUNTER — Telehealth: Payer: Self-pay | Admitting: Family Medicine

## 2019-02-26 NOTE — Telephone Encounter (Signed)
Pt called and lvm stating that she was suppose to call and let us know if she developed a fever. Well she has, its low grade 99.2 - 99.8, please advise.

## 2019-02-26 NOTE — Telephone Encounter (Signed)
MD please advise

## 2019-02-27 ENCOUNTER — Other Ambulatory Visit: Payer: Self-pay | Admitting: Family Medicine

## 2019-02-27 MED ORDER — AMOXICILLIN 875 MG PO TABS: 875.0000 mg | ORAL_TABLET | Freq: Two times a day (BID) | ORAL | 0 refills | Status: DC

## 2019-02-27 NOTE — Telephone Encounter (Signed)
I sent amoxicillin 875 bid for 10 days to outpatient pharmacy for sinus infection.  Recommend staying away from others for 1 week as a precaution.  Does she want amoxicillin to go anywhere else?

## 2019-02-27 NOTE — Telephone Encounter (Signed)
Call placed to patient. No answer. VM full. 

## 2019-03-02 DIAGNOSIS — E039 Hypothyroidism, unspecified: Secondary | ICD-10-CM | POA: Diagnosis not present

## 2019-03-02 NOTE — Telephone Encounter (Signed)
Call placed to patient.   States that she had no further issues after initial fever. Reports that fever max was 99.8 and did not last 24hrs.   States that she has not had to pick up ABTx.

## 2019-03-09 DIAGNOSIS — E049 Nontoxic goiter, unspecified: Secondary | ICD-10-CM | POA: Diagnosis not present

## 2019-03-09 DIAGNOSIS — E039 Hypothyroidism, unspecified: Secondary | ICD-10-CM | POA: Diagnosis not present

## 2019-03-11 ENCOUNTER — Ambulatory Visit: Payer: 59

## 2019-03-25 MED FILL — LEVOTHYROXINE 88 MCG TABLET: 88 | 90 days supply | Qty: 90 | Fill #0

## 2019-07-03 MED FILL — LEVOTHYROXINE 88 MCG TABLET: 88 | 90 days supply | Qty: 90 | Fill #0

## 2019-08-04 DIAGNOSIS — Z20828 Contact with and (suspected) exposure to other viral communicable diseases: Secondary | ICD-10-CM | POA: Diagnosis not present

## 2019-10-14 MED FILL — LEVOTHYROXINE 88 MCG TABLET: 88 | 90 days supply | Qty: 90 | Fill #1

## 2019-10-20 ENCOUNTER — Other Ambulatory Visit: Payer: Self-pay

## 2019-10-20 DIAGNOSIS — Z20822 Contact with and (suspected) exposure to covid-19: Secondary | ICD-10-CM

## 2019-10-21 LAB — NOVEL CORONAVIRUS, NAA: SARS-CoV-2, NAA: NOT DETECTED

## 2020-01-12 MED FILL — LEVOTHYROXINE 88 MCG TABLET: 88 | 90 days supply | Qty: 90 | Fill #2

## 2020-02-22 ENCOUNTER — Telehealth: Payer: Self-pay | Admitting: Family Medicine

## 2020-02-22 NOTE — Telephone Encounter (Signed)
Patient called in stating that she has the [place] and is need of a new referral to see Dr. Chalmers Cater as she sees for hypothyroidism. No formal referral necessary we just need documentation in chart. [place] for referral

## 2020-02-23 NOTE — Telephone Encounter (Signed)
Ok with referral. 

## 2020-02-23 NOTE — Telephone Encounter (Signed)
Focus plan no official referral needs to be placed. Dr. Dennard Schaumann noted that he is ok with referral.

## 2020-03-09 ENCOUNTER — Other Ambulatory Visit (HOSPITAL_COMMUNITY): Payer: Self-pay | Admitting: Endocrinology

## 2020-03-10 ENCOUNTER — Other Ambulatory Visit: Payer: Self-pay | Admitting: Endocrinology

## 2020-03-10 DIAGNOSIS — E049 Nontoxic goiter, unspecified: Secondary | ICD-10-CM

## 2020-07-15 MED FILL — LEVOTHYROXINE 88 MCG TABLET: 88 | 90 days supply | Qty: 90 | Fill #1

## 2020-07-21 ENCOUNTER — Ambulatory Visit
Admission: RE | Admit: 2020-07-21 | Discharge: 2020-07-21 | Disposition: A | Discharge status: Home / Self Care | Payer: No Typology Code available for payment source | Source: Ambulatory Visit | Attending: Endocrinology | Admitting: Endocrinology

## 2020-07-21 DIAGNOSIS — E049 Nontoxic goiter, unspecified: Secondary | ICD-10-CM

## 2020-07-25 ENCOUNTER — Other Ambulatory Visit: Payer: Self-pay | Admitting: Endocrinology

## 2020-07-26 ENCOUNTER — Other Ambulatory Visit: Payer: Self-pay | Admitting: Endocrinology

## 2020-07-26 DIAGNOSIS — E042 Nontoxic multinodular goiter: Secondary | ICD-10-CM

## 2020-07-27 ENCOUNTER — Other Ambulatory Visit: Payer: Self-pay | Admitting: Family Medicine

## 2020-07-27 ENCOUNTER — Other Ambulatory Visit: Payer: Self-pay | Admitting: Physician Assistant

## 2020-07-27 DIAGNOSIS — R921 Mammographic calcification found on diagnostic imaging of breast: Secondary | ICD-10-CM

## 2020-08-17 ENCOUNTER — Ambulatory Visit
Admission: RE | Admit: 2020-08-17 | Discharge: 2020-08-17 | Disposition: A | Discharge status: Home / Self Care | Payer: No Typology Code available for payment source | Source: Ambulatory Visit | Attending: Family Medicine | Admitting: Family Medicine

## 2020-08-17 ENCOUNTER — Other Ambulatory Visit: Payer: Self-pay

## 2020-08-17 DIAGNOSIS — R921 Mammographic calcification found on diagnostic imaging of breast: Secondary | ICD-10-CM

## 2020-09-20 ENCOUNTER — Ambulatory Visit: Payer: 59 | Admitting: Family Medicine

## 2020-10-12 MED FILL — LEVOTHYROXINE 88 MCG TABLET: 88 | 90 days supply | Qty: 90 | Fill #2

## 2020-12-14 ENCOUNTER — Other Ambulatory Visit: Payer: No Typology Code available for payment source

## 2020-12-14 DIAGNOSIS — Z20822 Contact with and (suspected) exposure to covid-19: Secondary | ICD-10-CM

## 2020-12-15 LAB — SARS-COV-2, NAA 2 DAY TAT

## 2020-12-15 LAB — NOVEL CORONAVIRUS, NAA: SARS-CoV-2, NAA: NOT DETECTED

## 2021-01-04 ENCOUNTER — Ambulatory Visit
Admission: RE | Admit: 2021-01-04 | Discharge: 2021-01-04 | Disposition: A | Discharge status: Home / Self Care | Payer: No Typology Code available for payment source | Source: Ambulatory Visit | Attending: Endocrinology | Admitting: Endocrinology

## 2021-01-04 DIAGNOSIS — E042 Nontoxic multinodular goiter: Secondary | ICD-10-CM

## 2021-01-12 MED FILL — LEVOTHYROXINE 88 MCG TABLET: 88 | 90 days supply | Qty: 90 | Fill #3

## 2021-01-13 ENCOUNTER — Ambulatory Visit
Admission: RE | Admit: 2021-01-13 | Discharge: 2021-01-13 | Disposition: A | Discharge status: Home / Self Care | Payer: 59 | Source: Ambulatory Visit | Attending: Endocrinology | Admitting: Endocrinology

## 2021-01-13 DIAGNOSIS — E042 Nontoxic multinodular goiter: Secondary | ICD-10-CM | POA: Diagnosis not present

## 2021-03-02 DIAGNOSIS — E039 Hypothyroidism, unspecified: Secondary | ICD-10-CM | POA: Diagnosis not present

## 2021-04-17 ENCOUNTER — Other Ambulatory Visit (HOSPITAL_COMMUNITY): Payer: Self-pay

## 2021-04-17 ENCOUNTER — Other Ambulatory Visit: Payer: Self-pay

## 2021-04-17 DIAGNOSIS — E039 Hypothyroidism, unspecified: Secondary | ICD-10-CM | POA: Diagnosis not present

## 2021-04-17 DIAGNOSIS — E049 Nontoxic goiter, unspecified: Secondary | ICD-10-CM | POA: Diagnosis not present

## 2021-04-17 MED ORDER — LEVOTHYROXINE SODIUM 88 MCG PO TABS
88.0000 ug | ORAL_TABLET | Freq: Every day | ORAL | 4 refills | Status: DC
  Filled 2021-04-17: qty 90, 90d supply, fill #0
  Filled 2021-07-17: qty 90, 90d supply, fill #1

## 2021-04-18 ENCOUNTER — Other Ambulatory Visit (HOSPITAL_COMMUNITY): Payer: Self-pay

## 2021-04-18 MED ORDER — LEVOTHYROXINE SODIUM 88 MCG PO TABS
88.0000 ug | ORAL_TABLET | Freq: Every day | ORAL | 4 refills | Status: DC
  Filled 2021-04-18: qty 90, 90d supply, fill #0

## 2021-04-19 ENCOUNTER — Other Ambulatory Visit (HOSPITAL_COMMUNITY): Payer: Self-pay

## 2021-07-10 ENCOUNTER — Other Ambulatory Visit: Payer: Self-pay | Admitting: Family Medicine

## 2021-07-10 DIAGNOSIS — Z1231 Encounter for screening mammogram for malignant neoplasm of breast: Secondary | ICD-10-CM

## 2021-07-17 ENCOUNTER — Other Ambulatory Visit (HOSPITAL_COMMUNITY): Payer: Self-pay

## 2021-08-01 ENCOUNTER — Other Ambulatory Visit: Payer: 59

## 2021-08-01 ENCOUNTER — Other Ambulatory Visit: Payer: Self-pay

## 2021-08-01 DIAGNOSIS — Z136 Encounter for screening for cardiovascular disorders: Secondary | ICD-10-CM

## 2021-08-01 DIAGNOSIS — Z13220 Encounter for screening for lipoid disorders: Secondary | ICD-10-CM

## 2021-08-01 DIAGNOSIS — Z1159 Encounter for screening for other viral diseases: Secondary | ICD-10-CM

## 2021-08-02 LAB — COMPLETE METABOLIC PANEL WITH GFR
AG Ratio: 1.5 (calc) (ref 1.0–2.5)
ALT: 12 U/L (ref 6–29)
AST: 14 U/L (ref 10–35)
Albumin: 4.2 g/dL (ref 3.6–5.1)
Alkaline phosphatase (APISO): 68 U/L (ref 37–153)
BUN: 14 mg/dL (ref 7–25)
CO2: 26 mmol/L (ref 20–32)
Calcium: 9.8 mg/dL (ref 8.6–10.4)
Chloride: 106 mmol/L (ref 98–110)
Creat: 0.92 mg/dL (ref 0.50–1.05)
Globulin: 2.8 g/dL (calc) (ref 1.9–3.7)
Glucose, Bld: 86 mg/dL (ref 65–99)
Potassium: 4.5 mmol/L (ref 3.5–5.3)
Sodium: 142 mmol/L (ref 135–146)
Total Bilirubin: 0.8 mg/dL (ref 0.2–1.2)
Total Protein: 7 g/dL (ref 6.1–8.1)
eGFR: 71 mL/min/{1.73_m2} (ref >=60)

## 2021-08-02 LAB — CBC WITH DIFFERENTIAL/PLATELET
Absolute Monocytes: 541 cells/uL (ref 200–950)
Basophils Absolute: 42 cells/uL (ref 0–200)
Basophils Relative: 0.8 %
Eosinophils Absolute: 343 cells/uL (ref 15–500)
Eosinophils Relative: 6.6 %
HCT: 46.5 % — ABNORMAL HIGH (ref 35.0–45.0)
Hemoglobin: 14.9 g/dL (ref 11.7–15.5)
Lymphs Abs: 1347 cells/uL (ref 850–3900)
MCH: 29.7 pg (ref 27.0–33.0)
MCHC: 32 g/dL (ref 32.0–36.0)
MCV: 92.8 fL (ref 80.0–100.0)
MPV: 12.1 fL (ref 7.5–12.5)
Monocytes Relative: 10.4 %
Neutro Abs: 2928 cells/uL (ref 1500–7800)
Neutrophils Relative %: 56.3 %
Platelets: 164 10*3/uL (ref 140–400)
RBC: 5.01 10*6/uL (ref 3.80–5.10)
RDW: 13.1 % (ref 11.0–15.0)
Total Lymphocyte: 25.9 %
WBC: 5.2 10*3/uL (ref 3.8–10.8)

## 2021-08-02 LAB — HEPATITIS C ANTIBODY
Hepatitis C Ab: NONREACTIVE
SIGNAL TO CUT-OFF: 0.01 (ref <1.00)

## 2021-08-02 LAB — LIPID PANEL
Cholesterol: 194 mg/dL (ref <200)
HDL: 64 mg/dL (ref >=50)
LDL Cholesterol (Calc): 111 mg/dL (calc) — ABNORMAL HIGH
Non-HDL Cholesterol (Calc): 130 mg/dL (calc) — ABNORMAL HIGH (ref <130)
Total CHOL/HDL Ratio: 3 (calc) (ref <5.0)
Triglycerides: 89 mg/dL (ref <150)

## 2021-08-08 ENCOUNTER — Other Ambulatory Visit: Payer: Self-pay

## 2021-08-08 ENCOUNTER — Ambulatory Visit (INDEPENDENT_AMBULATORY_CARE_PROVIDER_SITE_OTHER): Payer: 59 | Admitting: Family Medicine

## 2021-08-08 ENCOUNTER — Encounter: Payer: Self-pay | Admitting: Family Medicine

## 2021-08-08 VITALS — BP 128/70 | HR 82 | Temp 98.0°F | Resp 14 | Ht 67.0 in | Wt 191.0 lb

## 2021-08-08 DIAGNOSIS — Z0000 Encounter for general adult medical examination without abnormal findings: Secondary | ICD-10-CM

## 2021-08-08 NOTE — Progress Notes (Signed)
Subjective:    Patient ID: Amanda Sampson, female    DOB: 02/24/1961, 60 y.o.   MRN: [id number]  HPI  Patient is a 60 y/o WF here for CPE.  Her last colonoscopy was 2019.  There were 2 polyps 1 of which was a tubular adenoma.  Her gastroenterologist recommended a repeat colonoscopy in 2024.  Last mammogram 9/21 is coming due.  Last Pap smear was 2019 and is due again this year.  Patient request to defer her Pap smear at the present time.  She is already scheduled her mammogram.  She is not yet due for a bone density.  She is due for a flu shot, the shingles vaccine, and a COVID booster.  She would like to consider this prior to receiving any of these shots.  Her most recent lab work is listed below  Lab on 08/01/2021  Component Date Value Ref Range Status   WBC 08/01/2021 5.2  3.8 - 10.8 Thousand/uL Final   RBC 08/01/2021 5.01  3.80 - 5.10 Million/uL Final   Hemoglobin 08/01/2021 14.9  11.7 - 15.5 g/dL Final   HCT 08/01/2021 46.5 (A) 35.0 - 45.0 % Final   MCV 08/01/2021 92.8  80.0 - 100.0 fL Final   MCH 08/01/2021 29.7  27.0 - 33.0 pg Final   MCHC 08/01/2021 32.0  32.0 - 36.0 g/dL Final   RDW 08/01/2021 13.1  11.0 - 15.0 % Final   Platelets 08/01/2021 164  140 - 400 Thousand/uL Final   MPV 08/01/2021 12.1  7.5 - 12.5 fL Final   Neutro Abs 08/01/2021 2,928  1,500 - 7,800 cells/uL Final   Lymphs Abs 08/01/2021 1,347  850 - 3,900 cells/uL Final   Absolute Monocytes 08/01/2021 541  200 - 950 cells/uL Final   Eosinophils Absolute 08/01/2021 343  15 - 500 cells/uL Final   Basophils Absolute 08/01/2021 42  0 - 200 cells/uL Final   Neutrophils Relative % 08/01/2021 56.3  % Final   Total Lymphocyte 08/01/2021 25.9  % Final   Monocytes Relative 08/01/2021 10.4  % Final   Eosinophils Relative 08/01/2021 6.6  % Final   Basophils Relative 08/01/2021 0.8  % Final   Glucose, Bld 08/01/2021 86  65 - 99 mg/dL Final   Comment: .            Fasting reference interval .    BUN 08/01/2021 14  7 - 25  mg/dL Final   Creat 08/01/2021 0.92  0.50 - 1.05 mg/dL Final   eGFR 08/01/2021 71  > OR = 60 mL/min/1.90m Final   Comment: The eGFR is based on the CKD-EPI 2021 equation. To calculate  the new eGFR from a previous Creatinine or Cystatin C result, go to https://www.kidney.org/professionals/ kdoqi/gfr%5Fcalculator    BUN/Creatinine Ratio 058/09/9833NOT APPLICABLE  6 - 22 (calc) Final   Sodium 08/01/2021 142  135 - 146 mmol/L Final   Potassium 08/01/2021 4.5  3.5 - 5.3 mmol/L Final   Chloride 08/01/2021 106  98 - 110 mmol/L Final   CO2 08/01/2021 26  20 - 32 mmol/L Final   Calcium 08/01/2021 9.8  8.6 - 10.4 mg/dL Final   Total Protein 08/01/2021 7.0  6.1 - 8.1 g/dL Final   Albumin 08/01/2021 4.2  3.6 - 5.1 g/dL Final   Globulin 08/01/2021 2.8  1.9 - 3.7 g/dL (calc) Final   AG Ratio 08/01/2021 1.5  1.0 - 2.5 (calc) Final   Total Bilirubin 08/01/2021 0.8  0.2 - 1.2 mg/dL Final  Alkaline phosphatase (APISO) 08/01/2021 68  37 - 153 U/L Final   AST 08/01/2021 14  10 - 35 U/L Final   ALT 08/01/2021 12  6 - 29 U/L Final   Cholesterol 08/01/2021 194  <200 mg/dL Final   HDL 08/01/2021 64  > OR = 50 mg/dL Final   Triglycerides 08/01/2021 89  <150 mg/dL Final   LDL Cholesterol (Calc) 08/01/2021 111 (A) mg/dL (calc) Final   Comment: Reference range: <100 . Desirable range <100 mg/dL for primary prevention;   <70 mg/dL for patients with CHD or diabetic patients  with > or = 2 CHD risk factors. Marland Kitchen LDL-C is now calculated using the Martin-Hopkins  calculation, which is a validated novel method providing  better accuracy than the Friedewald equation in the  estimation of LDL-C.  Cresenciano Genre et al. Annamaria Helling. 9629;528(41): 2061-2068  (http://education.[facility].com/faq/FAQ164)    Total CHOL/HDL Ratio 08/01/2021 3.0  <5.0 (calc) Final   Non-HDL Cholesterol (Calc) 08/01/2021 130 (A) <130 mg/dL (calc) Final   Comment: For patients with diabetes plus 1 major ASCVD risk  factor, treating to a  non-HDL-C goal of <100 mg/dL  (LDL-C of <70 mg/dL) is considered a therapeutic  option.    Hepatitis C Ab 08/01/2021 NON-REACTIVE  NON-REACTIVE Final   SIGNAL TO CUT-OFF 08/01/2021 0.01  <1.00 Final   Comment: . HCV antibody was non-reactive. There is no laboratory  evidence of HCV infection. . In most cases, no further action is required. However, if recent HCV exposure is suspected, a test for HCV RNA (test code [phone]) is suggested. . For additional information please refer to http://education.[facility].com/faq/FAQ22v1 (This link is being provided for informational/ educational purposes only.) .     Past Medical History:  Diagnosis Date   Goiter    hypothyroid   Hemorrhoids    Prolapsed external hemorrhoids 06/03/2018   Rectocele    Past Surgical History:  Procedure Laterality Date   WISDOM TOOTH EXTRACTION     Current Outpatient Medications on File Prior to Visit  Medication Sig Dispense Refill   amoxicillin (AMOXIL) 875 MG tablet Take 1 tablet (875 mg total) by mouth 2 (two) times daily. 20 tablet 0   fluticasone (FLONASE) 50 MCG/ACT nasal spray Place 2 sprays into both nostrils daily. 16 g 6   ibuprofen (ADVIL,MOTRIN) 200 MG tablet Take 600 mg by mouth every 6 (six) hours as needed for pain.     levocetirizine (XYZAL) 5 MG tablet Take 1 tablet (5 mg total) by mouth every evening. 30 tablet 0   levothyroxine (SYNTHROID) 88 MCG tablet TAKE 1 TABLET BY MOUTH ONCE A DAY 90 tablet 4   levothyroxine (SYNTHROID) 88 MCG tablet Take 1 tablet (88 mcg total) by mouth daily. 90 tablet 4   levothyroxine (SYNTHROID) 88 MCG tablet Take 1 tablet (88 mcg total) by mouth daily. 90 tablet 4   levothyroxine (SYNTHROID, LEVOTHROID) 88 MCG tablet TAKE 1 TABLET (88 MCG TOTAL) BY MOUTH DAILY. 30 tablet 0   Multiple Vitamin (MULTIVITAMIN) tablet Take 1 tablet by mouth daily.     Current Facility-Administered Medications on File Prior to Visit  Medication Dose Route Frequency Provider  Last Rate Last Admin   0.9 %  sodium chloride infusion  500 mL Intravenous Once Gatha Mayer, MD      No Known Allergies Social History   Socioeconomic History   Marital status: Married    Spouse name: Not on file   Number of children: Not on file   Years of  education: Not on file   Highest education level: Not on file  Occupational History   Not on file  Tobacco Use   Smoking status: Never   Smokeless tobacco: Never  Vaping Use   Vaping Use: Never used  Substance and Sexual Activity   Alcohol use: No    Alcohol/week: 0.0 standard drinks   Drug use: No   Sexual activity: Never    Birth control/protection: None  Other Topics Concern   Not on file  Social History Narrative   Entered 12/2014:    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   She is married.   She has 5 children. The youngest is now 15 years old.   Social Determinants of Health   Financial Resource Strain: Not on file  Food Insecurity: Not on file  Transportation Needs: Not on file  Physical Activity: Not on file  Stress: Not on file  Social Connections: Not on file  Intimate Partner Violence: Not on file   Family History  Problem Relation Age of Onset   Breast cancer Mother    Cancer Mother 64       breast   Pancreatic cancer Father    Cancer Father        pancreatic   Hearing loss Father    Lymphoma Maternal Grandfather    Cancer Maternal Grandfather        lymphoma   Alcohol abuse Brother    Diabetes Maternal Grandmother    Hearing loss Brother    Learning disabilities Brother    Depression Brother    Depression Brother    Diabetes Sister    Colon cancer Neg Hx    Esophageal cancer Neg Hx    Stomach cancer Neg Hx       Review of Systems  All other systems reviewed and are negative.     Objective:   Physical Exam Vitals reviewed.  Constitutional:      General: She is not in acute distress.    Appearance: She is well-developed. She is not diaphoretic.   HENT:     Head: Normocephalic and atraumatic.     Right Ear: External ear normal.     Left Ear: External ear normal.     Nose: Nose normal.     Mouth/Throat:     Pharynx: No oropharyngeal exudate.  Eyes:     General: No scleral icterus.       Right eye: No discharge.        Left eye: No discharge.     Conjunctiva/sclera: Conjunctivae normal.     Pupils: Pupils are equal, round, and reactive to light.  Neck:     Thyroid: No thyromegaly.     Vascular: No JVD.     Trachea: No tracheal deviation.  Cardiovascular:     Rate and Rhythm: Normal rate and regular rhythm.     Heart sounds: Normal heart sounds. No murmur heard.   No friction rub. No gallop.  Pulmonary:     Effort: Pulmonary effort is normal. No respiratory distress.     Breath sounds: Normal breath sounds. No stridor. No wheezing or rales.  Chest:     Chest wall: No tenderness.  Abdominal:     General: Bowel sounds are normal. There is no distension.     Palpations: Abdomen is soft. There is no mass.     Tenderness: There is no abdominal tenderness. There is no guarding or rebound.  Musculoskeletal:  General: No tenderness or deformity. Normal range of motion.     Cervical back: Normal range of motion and neck supple.  Lymphadenopathy:     Cervical: No cervical adenopathy.  Skin:    General: Skin is warm.     Coloration: Skin is not pale.     Findings: No erythema or rash.  Neurological:     Mental Status: She is alert and oriented to person, place, and time.     Cranial Nerves: No cranial nerve deficit.     Motor: No abnormal muscle tone.     Coordination: Coordination normal.     Deep Tendon Reflexes: Reflexes are normal and symmetric. Reflexes normal.  Psychiatric:        Behavior: Behavior normal.        Thought Content: Thought content normal.        Judgment: Judgment normal.          Assessment & Plan:  General medical exam Physical exam today is completely normal.  Colonoscopy is  up-to-date.  Mammogram is already been scheduled for later this year.  Recommended a flu shot, the shingles vaccine, and a COVID shot.  Bone density is not yet due.  Reviewed lab work with the patient.  10-year cardiovascular risk is 3.3% and does not require statin although the patient admits that she would like to try fish oil.  She is due for a Pap smear however she politely declines this and defers this to a later date.  The remainder of her preventative care is up-to-date.

## 2021-08-29 DIAGNOSIS — Z1231 Encounter for screening mammogram for malignant neoplasm of breast: Secondary | ICD-10-CM

## 2021-09-05 ENCOUNTER — Ambulatory Visit: Payer: 59

## 2021-10-10 ENCOUNTER — Ambulatory Visit
Admission: RE | Admit: 2021-10-10 | Discharge: 2021-10-10 | Disposition: A | Discharge status: Home / Self Care | Payer: 59 | Source: Ambulatory Visit | Attending: Family Medicine | Admitting: Family Medicine

## 2021-10-10 ENCOUNTER — Other Ambulatory Visit: Payer: Self-pay

## 2021-10-10 DIAGNOSIS — Z1231 Encounter for screening mammogram for malignant neoplasm of breast: Secondary | ICD-10-CM | POA: Diagnosis not present

## 2021-10-19 ENCOUNTER — Other Ambulatory Visit: Payer: Self-pay

## 2021-10-19 ENCOUNTER — Encounter (HOSPITAL_COMMUNITY): Payer: Self-pay

## 2021-10-19 ENCOUNTER — Ambulatory Visit (HOSPITAL_COMMUNITY)
Admission: EM | Admit: 2021-10-19 | Discharge: 2021-10-19 | Disposition: A | Discharge status: Home / Self Care | Payer: 59 | Attending: Internal Medicine | Admitting: Internal Medicine

## 2021-10-19 ENCOUNTER — Other Ambulatory Visit (HOSPITAL_COMMUNITY): Payer: Self-pay

## 2021-10-19 DIAGNOSIS — J040 Acute laryngitis: Secondary | ICD-10-CM

## 2021-10-19 DIAGNOSIS — B9789 Other viral agents as the cause of diseases classified elsewhere: Secondary | ICD-10-CM | POA: Diagnosis not present

## 2021-10-19 MED ORDER — LEVOTHYROXINE SODIUM 88 MCG PO TABS
88.0000 ug | ORAL_TABLET | Freq: Every day | ORAL | 4 refills | Status: DC
  Filled 2021-10-19 (×2): qty 90, 90d supply, fill #0
  Filled 2022-01-16: qty 90, 90d supply, fill #1
  Filled 2022-04-16: qty 90, 90d supply, fill #2
  Filled 2022-07-20: qty 90, 90d supply, fill #3

## 2021-10-19 NOTE — ED Provider Notes (Signed)
[place]    CSN: [id number] Arrival date & time: 10/19/21  1217      History   Chief Complaint Chief Complaint  Patient presents with   Hoarse    HPI Amanda Sampson is a 60 y.o. female comes to urgent care with 2 days history of hoarseness of voice and postnasal drip.  Patient's symptoms started insidiously and has been persistent.  She denies any fever, chills, nasal itching, eye watering, headaches or body aches.  She has a cough which is nonproductive.  Home COVID test on 2 occasions were negative.  No shortness of breath.  Patient endorses sick contacts with similar symptoms.  No shortness of breath or wheezing. HPI  Past Medical History:  Diagnosis Date   Goiter    hypothyroid   Hemorrhoids    Prolapsed external hemorrhoids 06/03/2018   Rectocele     Patient Active Problem List   Diagnosis Date Noted   Prolapsed external hemorrhoids 06/03/2018   Hx of adenomatous polyp of colon 06/03/2018   Hypothyroidism     Past Surgical History:  Procedure Laterality Date   WISDOM TOOTH EXTRACTION      OB History   No obstetric history on file.      Home Medications    Prior to Admission medications   Medication Sig Start Date End Date Taking? Authorizing Provider  ibuprofen (ADVIL,MOTRIN) 200 MG tablet Take 600 mg by mouth every 6 (six) hours as needed for pain.   Yes [provider]  levothyroxine (SYNTHROID) 88 MCG tablet TAKE 1 TABLET BY MOUTH ONCE A DAY 03/09/20  Yes Balan, Bindubal, MD  Multiple Vitamin (MULTIVITAMIN) tablet Take 1 tablet by mouth daily.   Yes [provider]    Family History Family History  Problem Relation Age of Onset   Breast cancer Mother    Cancer Mother 56       breast   Pancreatic cancer Father    Cancer Father        pancreatic   Hearing loss Father    Lymphoma Maternal Grandfather    Cancer Maternal Grandfather        lymphoma   Alcohol abuse Brother    Diabetes Maternal Grandmother     Hearing loss Brother    Learning disabilities Brother    Depression Brother    Depression Brother    Diabetes Sister    Colon cancer Neg Hx    Esophageal cancer Neg Hx    Stomach cancer Neg Hx     Social History Social History   Tobacco Use   Smoking status: Never   Smokeless tobacco: Never  Vaping Use   Vaping Use: Never used  Substance Use Topics   Alcohol use: No    Alcohol/week: 0.0 standard drinks   Drug use: No     Allergies   Patient has no known allergies.   Review of Systems Review of Systems  Constitutional:  Negative for chills and fever.  HENT:  Positive for congestion, postnasal drip and voice change. Negative for sore throat and trouble swallowing.   Respiratory:  Positive for cough.   Gastrointestinal: Negative.   Neurological: Negative.     Physical Exam Triage Vital Signs ED Triage Vitals  Enc Vitals Group     BP 10/19/21 1346 (!) 158/88     Pulse Rate 10/19/21 1346 64     Resp 10/19/21 1346 18     Temp 10/19/21 1346 98.3 F (36.8 C)  Temp Source 10/19/21 1346 Oral     SpO2 10/19/21 1346 95 %     Weight --      Height --      Head Circumference --      Peak Flow --      Pain Score 10/19/21 1344 0     Pain Loc --      Pain Edu? --      Excl. in [place]? --    No data found.  Updated Vital Signs BP (!) 158/88   Pulse 64   Temp 98.3 F (36.8 C) (Oral)   Resp 18   LMP  (LMP Unknown) Comment: irregular  SpO2 95%   Visual Acuity Right Eye Distance:   Left Eye Distance:   Bilateral Distance:    Right Eye Near:   Left Eye Near:    Bilateral Near:     Physical Exam Vitals and nursing note reviewed.  Constitutional:      General: She is not in acute distress.    Appearance: Normal appearance. She is not ill-appearing.  HENT:     Right Ear: Tympanic membrane normal.     Left Ear: Tympanic membrane normal.     Nose: No rhinorrhea.     Mouth/Throat:     Pharynx: No posterior oropharyngeal erythema.  Eyes:     Pupils: Pupils  are equal, round, and reactive to light.  Cardiovascular:     Rate and Rhythm: Normal rate and regular rhythm.  Pulmonary:     Effort: Pulmonary effort is normal.     Breath sounds: Normal breath sounds.  Musculoskeletal:     Cervical back: Normal range of motion and neck supple.  Skin:    General: Skin is warm.     Capillary Refill: Capillary refill takes less than 2 seconds.  Neurological:     Mental Status: She is alert.     UC Treatments / Results  Labs (all labs ordered are listed, but only abnormal results are displayed) Labs Reviewed - No data to display  EKG   Radiology No results found.  Procedures Procedures (including critical care time)  Medications Ordered in UC Medications - No data to display  Initial Impression / Assessment and Plan / UC Course  I have reviewed the triage vital signs and the nursing notes.  Pertinent labs & imaging results that were available during my care of the patient were reviewed by me and considered in my medical decision making (see chart for details).     1.  Viral laryngitis: Humidifier use is recommended Fluticasone nasal spray as well as Zyrtec Voice rest Maintain adequate hydration Reassurance given Return to urgent care if symptoms persist or worsens. Final Clinical Impressions(s) / UC Diagnoses   Final diagnoses:  Viral laryngitis     Discharge Instructions      Voice rest Maintain adequate hydration Flonase and Zyrtec use may help Return to urgent care if symptoms worsen      ED Prescriptions   None    PDMP not reviewed this encounter.   Chase Picket, MD 10/21/21 1946

## 2021-10-19 NOTE — ED Triage Notes (Signed)
Pt presents with 'numb' throat as well as hoarseness and post nasal drip x 2 days.  No fever, HA, body aches.  Very mild cough.  Two home COVID tests were negative.

## 2021-10-19 NOTE — Discharge Instructions (Addendum)
Voice rest Maintain adequate hydration Flonase and Zyrtec use may help Return to urgent care if symptoms worsen

## 2021-12-07 ENCOUNTER — Encounter: Payer: Self-pay | Admitting: Family Medicine

## 2021-12-12 ENCOUNTER — Telehealth: Payer: 59 | Admitting: Family Medicine

## 2021-12-12 ENCOUNTER — Other Ambulatory Visit: Payer: Self-pay

## 2021-12-12 DIAGNOSIS — U071 COVID-19: Secondary | ICD-10-CM | POA: Diagnosis not present

## 2021-12-12 NOTE — Progress Notes (Signed)
Subjective:    Patient ID: Amanda Sampson, female    DOB: 09-18-1961, 61 y.o.   MRN: [id number]  HPI  Patient is a very pleasant 61 year old Caucasian female being seen today as a telephone visit.  Phone call began at 12:00.  Phone call concluded at 1215.  Patient is currently at home.  I am currently in my office.  Patient consents to be seen via telephone.  She states that she started developing symptoms on Wednesday, January 4.  Symptoms included nausea and fatigue and decreased appetite.  On Thursday, January 5 she tested positive for COVID.  She continues to experience weakness and fatigue.  She has little bit of nausea.  However she denies any chest pain.  She denies any shortness of breath.  She denies any cough or pleurisy.  She does have some mild head congestion.  She has been checking her oxygen saturations and they are averaging 95 to 96% on room air. Past Medical History:  Diagnosis Date   Goiter    hypothyroid   Hemorrhoids    Prolapsed external hemorrhoids 06/03/2018   Rectocele    Past Surgical History:  Procedure Laterality Date   WISDOM TOOTH EXTRACTION     Current Outpatient Medications on File Prior to Visit  Medication Sig Dispense Refill   ibuprofen (ADVIL,MOTRIN) 200 MG tablet Take 600 mg by mouth every 6 (six) hours as needed for pain.     levothyroxine (SYNTHROID) 88 MCG tablet TAKE 1 TABLET BY MOUTH ONCE A DAY 90 tablet 4   levothyroxine (SYNTHROID) 88 MCG tablet Take 1 tablet (88 mcg total) by mouth daily. 90 tablet 4   Multiple Vitamin (MULTIVITAMIN) tablet Take 1 tablet by mouth daily.     No current facility-administered medications on file prior to visit.   No Known Allergies Social History   Socioeconomic History   Marital status: Married    Spouse name: Not on file   Number of children: Not on file   Years of education: Not on file   Highest education level: Not on file  Occupational History   Not on file  Tobacco Use   Smoking status: Never    Smokeless tobacco: Never  Vaping Use   Vaping Use: Never used  Substance and Sexual Activity   Alcohol use: No    Alcohol/week: 0.0 standard drinks   Drug use: No   Sexual activity: Never    Birth control/protection: None  Other Topics Concern   Not on file  Social History Narrative   Entered 12/2014:    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   She is married.   She has 5 children. The youngest is now 15 years old.   Social Determinants of Health   Financial Resource Strain: Not on file  Food Insecurity: Not on file  Transportation Needs: Not on file  Physical Activity: Not on file  Stress: Not on file  Social Connections: Not on file  Intimate Partner Violence: Not on file     Review of Systems  All other systems reviewed and are negative.     Objective:   Physical Exam        Assessment & Plan:  COVID Patient works with newborns.  Therefore I recommended that she not return to work for the next 10 days.  She is at the 5 to 6-day mark from symptom onset.  Her symptoms are very mild start antiviral therapy however we did  discuss that at length.  Patient is clinically improving.  She requires no other therapy at the present time for symptom control.  Seek medical attention immediately if develops chest pain or shortness of breath.

## 2022-01-16 ENCOUNTER — Other Ambulatory Visit (HOSPITAL_COMMUNITY): Payer: Self-pay

## 2022-04-16 ENCOUNTER — Other Ambulatory Visit (HOSPITAL_COMMUNITY): Payer: Self-pay

## 2022-04-16 MED ORDER — LEVOTHYROXINE SODIUM 88 MCG PO TABS
88.0000 ug | ORAL_TABLET | Freq: Every morning | ORAL | 4 refills | Status: DC
  Filled 2022-04-16: qty 90, 90d supply, fill #0

## 2022-04-20 ENCOUNTER — Other Ambulatory Visit (HOSPITAL_COMMUNITY): Payer: Self-pay

## 2022-07-20 ENCOUNTER — Other Ambulatory Visit (HOSPITAL_COMMUNITY): Payer: Self-pay

## 2022-07-23 ENCOUNTER — Other Ambulatory Visit (HOSPITAL_COMMUNITY): Payer: Self-pay

## 2022-08-13 ENCOUNTER — Encounter: Payer: Self-pay | Admitting: Family Medicine

## 2022-08-21 IMAGING — MG MM DIGITAL SCREENING BILAT W/ TOMO AND CAD
8 series · 8 of 24 positions shown · non-contrast
Comparison: Previous exam(s).

CLINICAL DATA: Screening.

EXAM:
DIGITAL SCREENING BILATERAL MAMMOGRAM WITH TOMOSYNTHESIS AND CAD
TECHNIQUE: Bilateral screening digital craniocaudal and mediolateral oblique
mammograms were obtained. Bilateral screening digital breast
tomosynthesis was performed. The images were evaluated with
computer-aided detection.

[R MLO synth-2D]
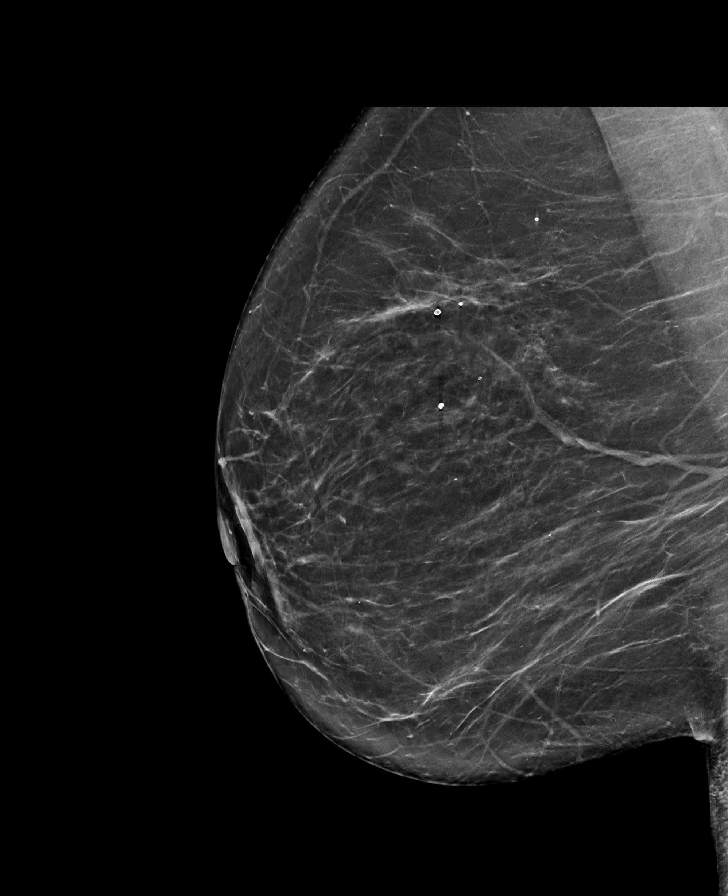

[R CC synth-2D]
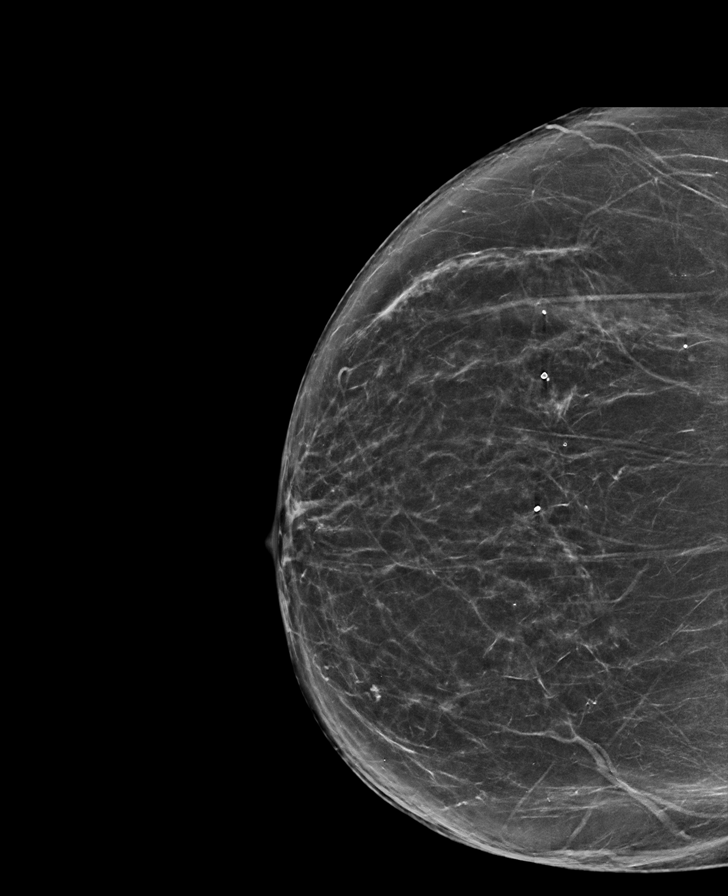

[L MLO synth-2D]
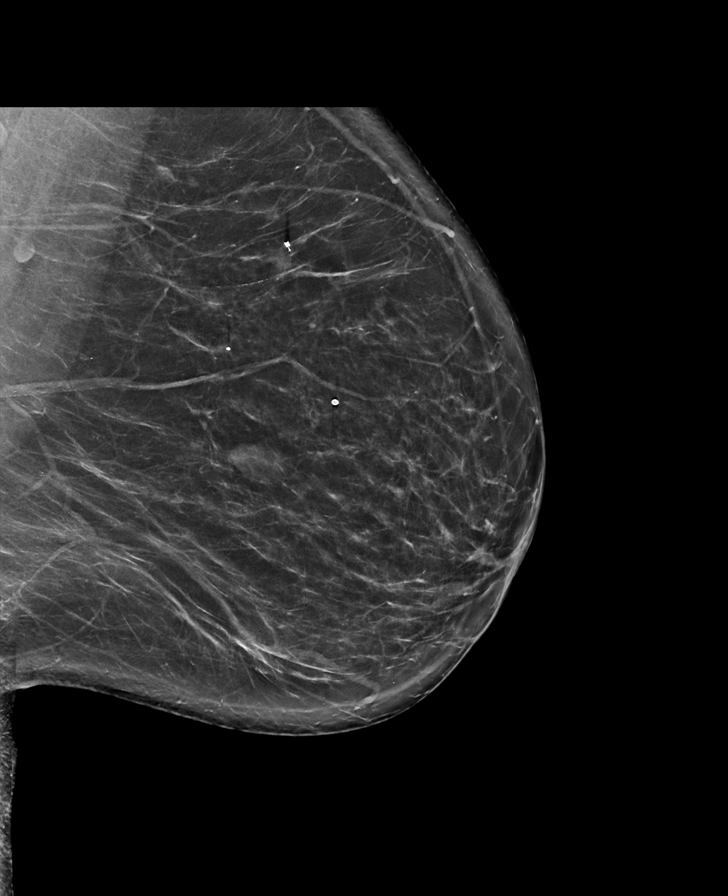

[L CC synth-2D]
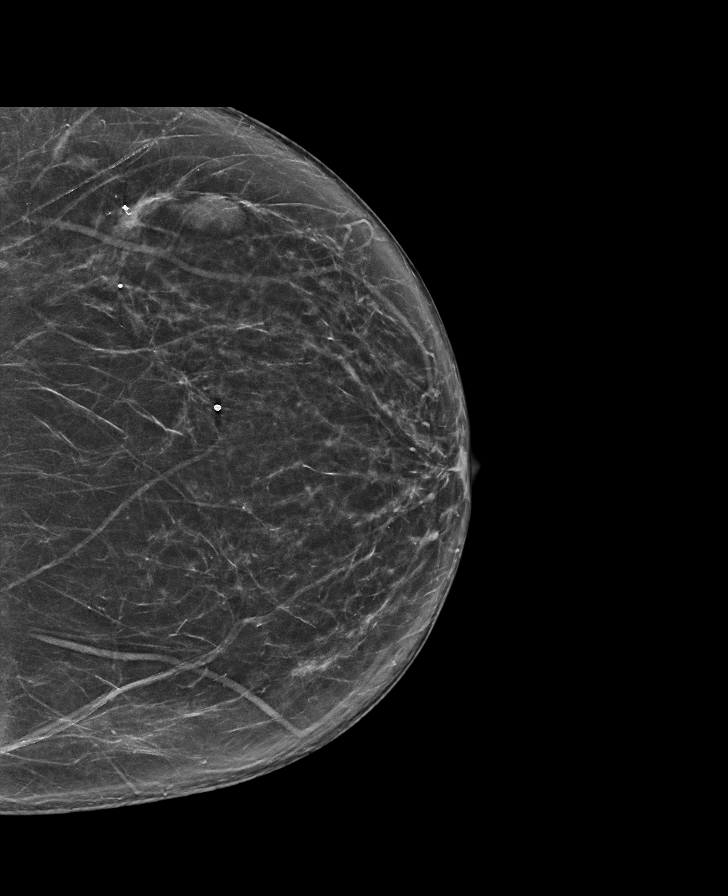

[R CC tomo · tomo slice 39/77.0]
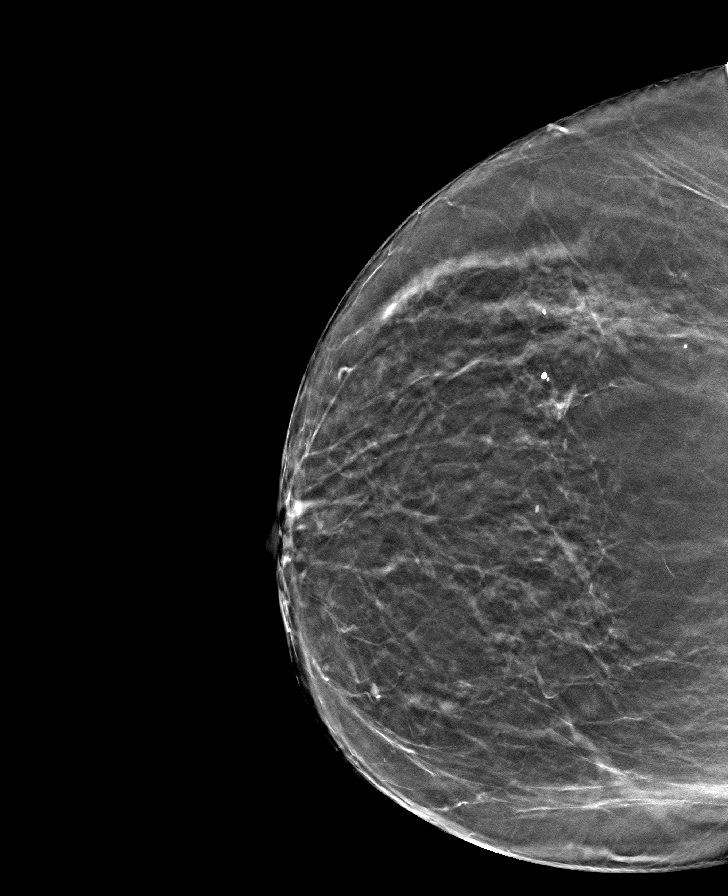

[R MLO tomo · tomo slice 43/86.0]
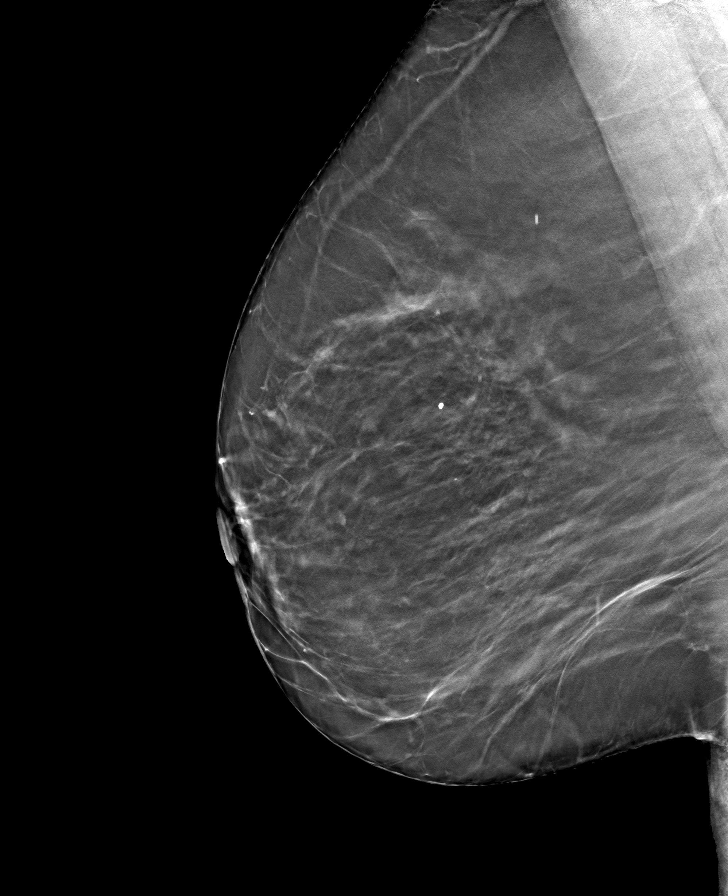

[L MLO tomo · tomo slice 45/88.0]
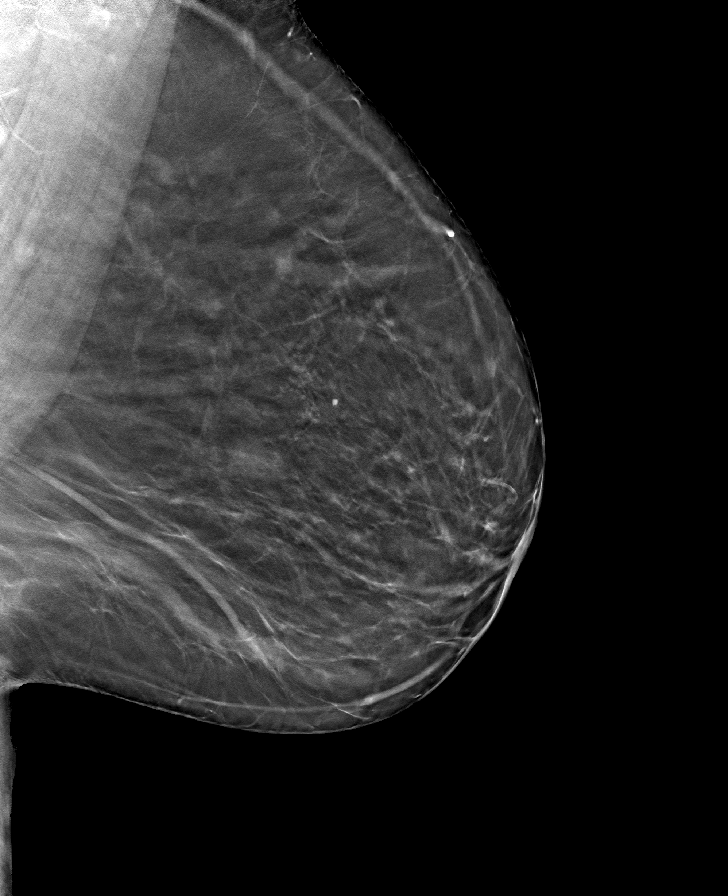

[L CC tomo · tomo slice 39/78.0]
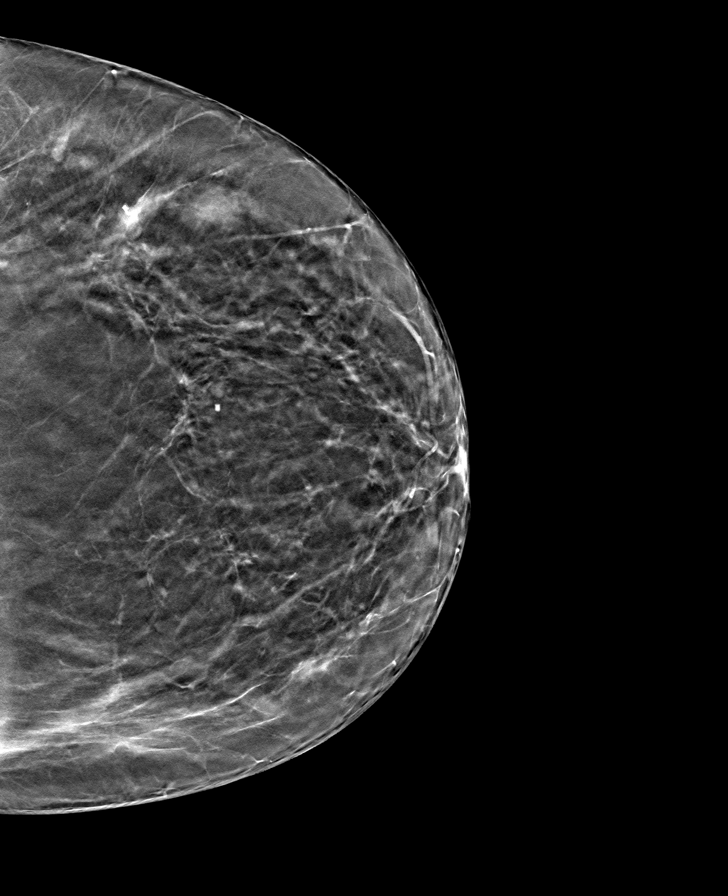

[8 of 24 positions shown; findings below may reference images not displayed]

ACR Breast Density Category b: There are scattered areas of
fibroglandular density.
FINDINGS: There are no findings suspicious for malignancy.
IMPRESSION: No mammographic evidence of malignancy. A result letter of this
screening mammogram will be mailed directly to the patient.

RECOMMENDATION:
Screening mammogram in one year. (Code:51-O-LD2)

BI-RADS CATEGORY  1: Negative.

## 2022-10-22 ENCOUNTER — Other Ambulatory Visit (HOSPITAL_COMMUNITY): Payer: Self-pay

## 2022-10-22 MED ORDER — LEVOTHYROXINE SODIUM 88 MCG PO TABS
88.0000 ug | ORAL_TABLET | Freq: Every morning | ORAL | 4 refills | Status: DC
  Filled 2022-10-22: qty 30, 30d supply, fill #0
  Filled 2022-11-20: qty 30, 30d supply, fill #1
  Filled 2022-12-23: qty 30, 30d supply, fill #2
  Filled 2023-01-21: qty 30, 30d supply, fill #3
  Filled 2023-02-21: qty 30, 30d supply, fill #4
  Filled 2023-03-23: qty 30, 30d supply, fill #5
  Filled 2023-04-23: qty 30, 30d supply, fill #6

## 2023-04-15 DIAGNOSIS — E049 Nontoxic goiter, unspecified: Secondary | ICD-10-CM | POA: Diagnosis not present

## 2023-04-23 ENCOUNTER — Other Ambulatory Visit: Payer: Self-pay

## 2023-04-23 ENCOUNTER — Other Ambulatory Visit (HOSPITAL_COMMUNITY): Payer: Self-pay

## 2023-04-23 DIAGNOSIS — E049 Nontoxic goiter, unspecified: Secondary | ICD-10-CM | POA: Diagnosis not present

## 2023-04-23 DIAGNOSIS — E039 Hypothyroidism, unspecified: Secondary | ICD-10-CM | POA: Diagnosis not present

## 2023-04-23 MED ORDER — LEVOTHYROXINE SODIUM 88 MCG PO TABS
88.0000 ug | ORAL_TABLET | Freq: Every morning | ORAL | 5 refills | Status: DC
  Filled 2023-04-23 (×2): qty 30, 30d supply, fill #0
  Filled 2023-05-24: qty 30, 30d supply, fill #1
  Filled 2023-06-25: qty 30, 30d supply, fill #2
  Filled 2023-07-23: qty 30, 30d supply, fill #3
  Filled 2023-08-26: qty 30, 30d supply, fill #4
  Filled 2023-09-27: qty 30, 30d supply, fill #5
  Filled 2023-10-28: qty 30, 30d supply, fill #6
  Filled 2023-11-28: qty 30, 30d supply, fill #7
  Filled 2023-12-24: qty 30, 30d supply, fill #8
  Filled 2024-01-27: qty 30, 30d supply, fill #9
  Filled 2024-02-27: qty 30, 30d supply, fill #10
  Filled 2024-03-27: qty 30, 30d supply, fill #11

## 2023-05-06 ENCOUNTER — Other Ambulatory Visit (HOSPITAL_BASED_OUTPATIENT_CLINIC_OR_DEPARTMENT_OTHER): Payer: Self-pay

## 2023-05-27 ENCOUNTER — Other Ambulatory Visit (HOSPITAL_COMMUNITY): Payer: Self-pay

## 2023-06-26 ENCOUNTER — Other Ambulatory Visit (HOSPITAL_COMMUNITY): Payer: Self-pay

## 2023-07-23 ENCOUNTER — Other Ambulatory Visit (HOSPITAL_COMMUNITY): Payer: Self-pay

## 2023-09-17 ENCOUNTER — Encounter: Payer: Self-pay | Admitting: Family Medicine

## 2023-09-17 ENCOUNTER — Ambulatory Visit (INDEPENDENT_AMBULATORY_CARE_PROVIDER_SITE_OTHER): Payer: 59 | Admitting: Family Medicine

## 2023-09-17 VITALS — BP 126/76 | HR 64 | Temp 97.8°F | Ht 67.0 in | Wt 197.0 lb

## 2023-09-17 DIAGNOSIS — Z13220 Encounter for screening for lipoid disorders: Secondary | ICD-10-CM | POA: Diagnosis not present

## 2023-09-17 DIAGNOSIS — Z0001 Encounter for general adult medical examination with abnormal findings: Secondary | ICD-10-CM | POA: Diagnosis not present

## 2023-09-17 DIAGNOSIS — E039 Hypothyroidism, unspecified: Secondary | ICD-10-CM

## 2023-09-17 DIAGNOSIS — Z23 Encounter for immunization: Secondary | ICD-10-CM | POA: Diagnosis not present

## 2023-09-17 DIAGNOSIS — Z1231 Encounter for screening mammogram for malignant neoplasm of breast: Secondary | ICD-10-CM

## 2023-09-17 DIAGNOSIS — Z1211 Encounter for screening for malignant neoplasm of colon: Secondary | ICD-10-CM

## 2023-09-17 NOTE — Progress Notes (Signed)
Subjective:    Patient ID: Amanda Sampson, female    DOB: [birth year], 62 y.o.   MRN: [id number]  HPI   Patient is a very pleasant 62 year old Caucasian female here today for physical exam.  She is due today for a flu shot as well as a shingles vaccine.  We discussed COVID and RSV and at the present time she defers these vaccinations.  Her last colonoscopy was in 2019.  Due to the presence of a tubular adenoma, her gastroenterologist recommended a repeat colonoscopy in 2024.  Therefore she is due for me to schedule this.  Her mammogram was last done in 2022.  This is due to.  She has no family history of osteoporosis.  Therefore we have deferred a bone density test at the present time.  Her last Pap smear was in 2019.  She declines this today.  Otherwise she is doing well with no concerns.  She is about to become a grandmother   Past Medical History:  Diagnosis Date   Goiter    hypothyroid   Hemorrhoids    Prolapsed external hemorrhoids 06/03/2018   Rectocele    Past Surgical History:  Procedure Laterality Date   WISDOM TOOTH EXTRACTION     Current Outpatient Medications on File Prior to Visit  Medication Sig Dispense Refill   ibuprofen (ADVIL,MOTRIN) 200 MG tablet Take 600 mg by mouth every 6 (six) hours as needed for pain.     levothyroxine (SYNTHROID) 88 MCG tablet Take 1 tablet (88 mcg total) by mouth every morning on an empty stomach 90 tablet 5   Multiple Vitamin (MULTIVITAMIN) tablet Take 1 tablet by mouth daily.     No current facility-administered medications on file prior to visit.  No Known Allergies Social History   Socioeconomic History   Marital status: Married    Spouse name: Not on file   Number of children: Not on file   Years of education: Not on file   Highest education level: Not on file  Occupational History   Not on file  Tobacco Use   Smoking status: Never   Smokeless tobacco: Never  Vaping Use   Vaping status: Never Used  Substance and Sexual  Activity   Alcohol use: No    Alcohol/week: 0.0 standard drinks of alcohol   Drug use: No   Sexual activity: Never    Birth control/protection: None  Other Topics Concern   Not on file  Social History Narrative   Entered 12/2014:    She works as a Psychologist, 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   She is married.   She has 5 children. The youngest is now 40 years old.   Social Determinants of Health   Financial Resource Strain: Not on file  Food Insecurity: Not on file  Transportation Needs: Not on file  Physical Activity: Not on file  Stress: Not on file  Social Connections: Unknown (04/16/2022)   Received from [facility]   Social Network    Social Network: Not on file  Intimate Partner Violence: Unknown (03/08/2022)   Received from [facility]   HITS    Physically Hurt: Not on file    Insult or Talk Down To: Not on file    Threaten Physical Harm: Not on file    Scream or Curse: Not on file   Family History  Problem Relation Age of Onset   Breast cancer Mother    Cancer Mother 40  breast   Pancreatic cancer Father    Cancer Father        pancreatic   Hearing loss Father    Lymphoma Maternal Grandfather    Cancer Maternal Grandfather        lymphoma   Alcohol abuse Brother    Diabetes Maternal Grandmother    Hearing loss Brother    Learning disabilities Brother    Depression Brother    Depression Brother    Diabetes Sister    Colon cancer Neg Hx    Esophageal cancer Neg Hx    Stomach cancer Neg Hx       Review of Systems  All other systems reviewed and are negative.      Objective:   Physical Exam Vitals reviewed.  Constitutional:      General: She is not in acute distress.    Appearance: She is well-developed. She is not diaphoretic.  HENT:     Head: Normocephalic and atraumatic.     Right Ear: External ear normal.     Left Ear: External ear normal.     Nose: Nose normal.     Mouth/Throat:     Pharynx: No oropharyngeal exudate.  Eyes:      General: No scleral icterus.       Right eye: No discharge.        Left eye: No discharge.     Conjunctiva/sclera: Conjunctivae normal.     Pupils: Pupils are equal, round, and reactive to light.  Neck:     Thyroid: No thyromegaly.     Vascular: No JVD.     Trachea: No tracheal deviation.  Cardiovascular:     Rate and Rhythm: Normal rate and regular rhythm.     Heart sounds: Normal heart sounds. No murmur heard.    No friction rub. No gallop.  Pulmonary:     Effort: Pulmonary effort is normal. No respiratory distress.     Breath sounds: Normal breath sounds. No stridor. No wheezing or rales.  Chest:     Chest wall: No tenderness.  Abdominal:     General: Bowel sounds are normal. There is no distension.     Palpations: Abdomen is soft. There is no mass.     Tenderness: There is no abdominal tenderness. There is no guarding or rebound.  Musculoskeletal:        General: No tenderness or deformity. Normal range of motion.     Cervical back: Normal range of motion and neck supple.  Lymphadenopathy:     Cervical: No cervical adenopathy.  Skin:    General: Skin is warm.     Coloration: Skin is not pale.     Findings: No erythema or rash.  Neurological:     Mental Status: She is alert and oriented to person, place, and time.     Cranial Nerves: No cranial nerve deficit.     Motor: No abnormal muscle tone.     Coordination: Coordination normal.     Deep Tendon Reflexes: Reflexes are normal and symmetric. Reflexes normal.  Psychiatric:        Behavior: Behavior normal.        Thought Content: Thought content normal.        Judgment: Judgment normal.           Assessment & Plan:  Encounter for screening mammogram for malignant neoplasm of breast - Plan: MM 3D DIAGNOSTIC MAMMOGRAM BILATERAL BREAST  Colon cancer screening - Plan: Ambulatory referral to Gastroenterology  Screening  for cholesterol level - Plan: CBC with Differential/Platelet, COMPLETE METABOLIC PANEL WITH  GFR, Lipid panel  Hypothyroidism, unspecified type - Plan: TSH  Physical exam today is completely normal.  I will schedule the patient for a mammogram as well as a colonoscopy.  She declines a Pap smear.  Will defer a bone density test until age 2.  She received her flu shot.  She received the shingles vaccine.  She declines RSV and COVID vaccination today.  I recommended 1200 mg a day of calcium and 1000 units a day of vitamin D.  I will check a CBC a CMP and a lipid panel due to her history of hypothyroidism and TSH.  Regular anticipatory guidance is provided.  Blood pressure is excellent.

## 2023-09-20 LAB — LIPID PANEL
Cholesterol: 207 mg/dL — ABNORMAL HIGH (ref <200)
HDL: 56 mg/dL (ref >=50)
LDL Cholesterol (Calc): 119 mg/dL — ABNORMAL HIGH
Non-HDL Cholesterol (Calc): 151 mg/dL — ABNORMAL HIGH (ref <130)
Total CHOL/HDL Ratio: 3.7 (calc) (ref <5.0)
Triglycerides: 200 mg/dL — ABNORMAL HIGH (ref <150)

## 2023-09-20 LAB — CBC WITH DIFFERENTIAL/PLATELET
Absolute Lymphocytes: 1201 {cells}/uL (ref 850–3900)
Absolute Monocytes: 536 {cells}/uL (ref 200–950)
Basophils Absolute: 31 {cells}/uL (ref 0–200)
Basophils Relative: 0.6 %
Eosinophils Absolute: 260 {cells}/uL (ref 15–500)
Eosinophils Relative: 5 %
HCT: 44.9 % (ref 35.0–45.0)
Hemoglobin: 14.6 g/dL (ref 11.7–15.5)
MCH: 30.5 pg (ref 27.0–33.0)
MCHC: 32.5 g/dL (ref 32.0–36.0)
MCV: 93.7 fL (ref 80.0–100.0)
MPV: 11.8 fL (ref 7.5–12.5)
Monocytes Relative: 10.3 %
Neutro Abs: 3172 {cells}/uL (ref 1500–7800)
Neutrophils Relative %: 61 %
Platelets: 198 10*3/uL (ref 140–400)
RBC: 4.79 10*6/uL (ref 3.80–5.10)
RDW: 12.2 % (ref 11.0–15.0)
Total Lymphocyte: 23.1 %
WBC: 5.2 10*3/uL (ref 3.8–10.8)

## 2023-09-20 LAB — COMPLETE METABOLIC PANEL WITH GFR
AG Ratio: 1.6 (calc) (ref 1.0–2.5)
ALT: 18 U/L (ref 6–29)
AST: 19 U/L (ref 10–35)
Albumin: 4.2 g/dL (ref 3.6–5.1)
Alkaline phosphatase (APISO): 74 U/L (ref 37–153)
BUN: 13 mg/dL (ref 7–25)
CO2: 26 mmol/L (ref 20–32)
Calcium: 9.8 mg/dL (ref 8.6–10.4)
Chloride: 105 mmol/L (ref 98–110)
Creat: 0.97 mg/dL (ref 0.50–1.05)
Globulin: 2.6 g/dL (ref 1.9–3.7)
Glucose, Bld: 122 mg/dL — ABNORMAL HIGH (ref 65–99)
Potassium: 4.2 mmol/L (ref 3.5–5.3)
Sodium: 140 mmol/L (ref 135–146)
Total Bilirubin: 0.7 mg/dL (ref 0.2–1.2)
Total Protein: 6.8 g/dL (ref 6.1–8.1)
eGFR: 66 mL/min/{1.73_m2} (ref >=60)

## 2023-09-20 LAB — HEMOGLOBIN A1C
Hgb A1c MFr Bld: 5.8 %{Hb} — ABNORMAL HIGH (ref <5.7)
Mean Plasma Glucose: 120 mg/dL
eAG (mmol/L): 6.6 mmol/L

## 2023-09-20 LAB — TSH: TSH: 1.28 m[IU]/L (ref 0.40–4.50)

## 2023-09-20 LAB — TEST AUTHORIZATION

## 2023-10-22 ENCOUNTER — Ambulatory Visit
Admission: RE | Admit: 2023-10-22 | Discharge: 2023-10-22 | Disposition: A | Discharge status: Home / Self Care | Payer: 59 | Source: Ambulatory Visit | Attending: Family Medicine | Admitting: Family Medicine

## 2023-10-22 DIAGNOSIS — Z1231 Encounter for screening mammogram for malignant neoplasm of breast: Secondary | ICD-10-CM | POA: Diagnosis not present

## 2023-10-30 ENCOUNTER — Encounter: Payer: Self-pay | Admitting: Internal Medicine

## 2023-12-10 ENCOUNTER — Telehealth: Payer: Self-pay

## 2023-12-10 ENCOUNTER — Ambulatory Visit: Payer: 59

## 2023-12-10 ENCOUNTER — Encounter: Payer: Self-pay | Admitting: Internal Medicine

## 2023-12-10 NOTE — Telephone Encounter (Signed)
Multiple attempts made to reach patient- NO answer-message left for patient to call back to the office to reschedule PV appt- if patient fails to call back to the office prior to end of business day ---PV and procedure appts will be cancelled and a no show letter will be sent to the patient;

## 2023-12-17 ENCOUNTER — Ambulatory Visit (INDEPENDENT_AMBULATORY_CARE_PROVIDER_SITE_OTHER): Payer: 59

## 2023-12-17 ENCOUNTER — Other Ambulatory Visit (HOSPITAL_COMMUNITY): Payer: Self-pay

## 2023-12-17 ENCOUNTER — Encounter (HOSPITAL_COMMUNITY): Payer: Self-pay

## 2023-12-17 ENCOUNTER — Ambulatory Visit (HOSPITAL_COMMUNITY)
Admission: EM | Admit: 2023-12-17 | Discharge: 2023-12-17 | Disposition: A | Discharge status: Home / Self Care | Payer: 59 | Attending: Neurology | Admitting: Neurology

## 2023-12-17 DIAGNOSIS — J189 Pneumonia, unspecified organism: Secondary | ICD-10-CM

## 2023-12-17 DIAGNOSIS — R051 Acute cough: Secondary | ICD-10-CM

## 2023-12-17 DIAGNOSIS — R0989 Other specified symptoms and signs involving the circulatory and respiratory systems: Secondary | ICD-10-CM | POA: Diagnosis not present

## 2023-12-17 DIAGNOSIS — R918 Other nonspecific abnormal finding of lung field: Secondary | ICD-10-CM | POA: Diagnosis not present

## 2023-12-17 DIAGNOSIS — R062 Wheezing: Secondary | ICD-10-CM | POA: Diagnosis not present

## 2023-12-17 LAB — POC COVID19/FLU A&B COMBO
Covid Antigen, POC: NEGATIVE
Influenza A Antigen, POC: NEGATIVE
Influenza B Antigen, POC: NEGATIVE

## 2023-12-17 MED ORDER — DOXYCYCLINE HYCLATE 100 MG PO CAPS
100.0000 mg | ORAL_CAPSULE | Freq: Two times a day (BID) | ORAL | 0 refills | Status: DC
  Filled 2023-12-17: qty 10, 5d supply, fill #0

## 2023-12-17 MED ORDER — ALBUTEROL SULFATE HFA 108 (90 BASE) MCG/ACT IN AERS
INHALATION_SPRAY | RESPIRATORY_TRACT | Status: AC
Start: 2023-12-17 — End: ?
  Filled 2023-12-17: qty 6.7

## 2023-12-17 MED ORDER — AEROCHAMBER PLUS FLO-VU MISC
1.0000 | Freq: Once | Status: AC
  Administered 2023-12-17: 1

## 2023-12-17 MED ORDER — PROMETHAZINE-DM 6.25-15 MG/5ML PO SYRP
5.0000 mL | ORAL_SOLUTION | Freq: Four times a day (QID) | ORAL | 0 refills | Status: AC | PRN
Start: 2023-12-17 — End: ?
  Filled 2023-12-17: qty 118, 6d supply, fill #0

## 2023-12-17 MED ORDER — AEROCHAMBER PLUS FLO-VU LARGE MISC
Status: AC
  Filled 2023-12-17: qty 1

## 2023-12-17 MED ORDER — BENZONATATE 100 MG PO CAPS
100.0000 mg | ORAL_CAPSULE | Freq: Three times a day (TID) | ORAL | 0 refills | Status: DC
  Filled 2023-12-17: qty 21, 7d supply, fill #0

## 2023-12-17 MED ORDER — ALBUTEROL SULFATE HFA 108 (90 BASE) MCG/ACT IN AERS
2.0000 | INHALATION_SPRAY | Freq: Once | RESPIRATORY_TRACT | Status: AC
  Administered 2023-12-17: 2 via RESPIRATORY_TRACT

## 2023-12-17 NOTE — Discharge Instructions (Addendum)
 Antibiotic sent to the pharmacy as well as medications for symptomatic cough relief.  Take Promethazine  DM cough medication to help with your cough at nighttime so that you are able to sleep. Do not drive, drink alcohol, or go to work while taking this medication since it can make you sleepy. Only take this at nighttime. .  If you develop any new or worsening symptoms or if your symptoms do not start to improve, please return here or follow-up with your primary care provider. If your symptoms are severe, please go to the emergency room.

## 2023-12-17 NOTE — ED Provider Notes (Signed)
 [facility]    CSN: [id number] Arrival date & time: 12/17/23  1541      History   Chief Complaint Chief Complaint  Patient presents with   Wheezing    HPI Amanda Sampson is a 63 y.o. female.   Initially started around Tuesday with cough and chest congestion.  She does work at a sales promotion account executive and they have had positive RSV and COVID over the last month.  She felt like her shortness of breath and  wheezing was the worst on Friday.  On further questioning she is more having rattling in her chest as opposed to wheezing.  She did take a couple of home COVID test that were negative.     Cough Cough characteristics:  Productive Sputum characteristics:  Yellow Severity:  Moderate Onset quality:  Gradual Duration:  4 days Timing:  Intermittent Progression:  Unchanged Chronicity:  New Smoker: no   Context: sick contacts   Worsened by:  Deep breathing and lying down Associated symptoms: shortness of breath     Past Medical History:  Diagnosis Date   Goiter    hypothyroid   Hemorrhoids    Prolapsed external hemorrhoids 06/03/2018   Rectocele     Patient Active Problem List   Diagnosis Date Noted   Prolapsed external hemorrhoids 06/03/2018   Hx of adenomatous polyp of colon 06/03/2018   Hypothyroidism     Past Surgical History:  Procedure Laterality Date   BREAST BIOPSY Left    WISDOM TOOTH EXTRACTION      OB History   No obstetric history on file.      Home Medications    Prior to Admission medications   Medication Sig Start Date End Date Taking? Authorizing Provider  benzonatate  (TESSALON ) 100 MG capsule Take 1 capsule (100 mg total) by mouth every 8 (eight) hours. 12/17/23  Yes Remi Pippin, NP  doxycycline  (VIBRAMYCIN ) 100 MG capsule Take 1 capsule (100 mg total) by mouth 2 (two) times daily. 12/17/23  Yes Remi Pippin, NP  promethazine -dextromethorphan (PROMETHAZINE -DM) 6.25-15 MG/5ML syrup Take 5 mLs by mouth 4 (four) times daily as  needed for cough. 12/17/23  Yes Rayshell Goecke, Pippin, NP  ibuprofen (ADVIL,MOTRIN) 200 MG tablet Take 600 mg by mouth every 6 (six) hours as needed for pain.    [provider]  levothyroxine  (SYNTHROID ) 88 MCG tablet Take 1 tablet (88 mcg total) by mouth every morning on an empty stomach 04/23/23     Multiple Vitamin (MULTIVITAMIN) tablet Take 1 tablet by mouth daily.    [provider]    Family History Family History  Problem Relation Age of Onset   Breast cancer Mother    Cancer Mother 25       breast   Pancreatic cancer Father    Cancer Father        pancreatic   Hearing loss Father    Lymphoma Maternal Grandfather    Cancer Maternal Grandfather        lymphoma   Alcohol abuse Brother    Diabetes Maternal Grandmother    Hearing loss Brother    Learning disabilities Brother    Depression Brother    Depression Brother    Diabetes Sister    Colon cancer Neg Hx    Esophageal cancer Neg Hx    Stomach cancer Neg Hx     Social History Social History   Tobacco Use   Smoking status: Never   Smokeless tobacco: Never  Vaping Use   Vaping  status: Never Used  Substance Use Topics   Alcohol use: No    Alcohol/week: 0.0 standard drinks of alcohol   Drug use: No     Allergies   Patient has no known allergies.   Review of Systems Review of Systems  Respiratory:  Positive for cough and shortness of breath.      Physical Exam Triage Vital Signs ED Triage Vitals  Encounter Vitals Group     BP 12/17/23 1625 (!) 153/88     Systolic BP Percentile --      Diastolic BP Percentile --      Pulse Rate 12/17/23 1625 70     Resp 12/17/23 1625 18     Temp 12/17/23 1625 98 F (36.7 C)     Temp Source 12/17/23 1625 Oral     SpO2 12/17/23 1625 95 %     Weight --      Height --      Head Circumference --      Peak Flow --      Pain Score 12/17/23 1626 0     Pain Loc --      Pain Education --      Exclude from Growth Chart --    No data found.  Updated Vital  Signs BP (!) 153/88 (BP Location: Right Arm)   Pulse 70   Temp 98 F (36.7 C) (Oral)   Resp 18   LMP  (LMP Unknown) Comment: irregular  SpO2 95%   Visual Acuity Right Eye Distance:   Left Eye Distance:   Bilateral Distance:    Right Eye Near:   Left Eye Near:    Bilateral Near:     Physical Exam Vitals and nursing note reviewed.  Constitutional:      General: She is not in acute distress.    Appearance: She is well-developed.  HENT:     Head: Normocephalic and atraumatic.  Eyes:     Conjunctiva/sclera: Conjunctivae normal.  Cardiovascular:     Rate and Rhythm: Normal rate and regular rhythm.     Heart sounds: No murmur heard. Pulmonary:     Breath sounds: Examination of the right-middle field reveals rhonchi. Rhonchi present.  Abdominal:     Palpations: Abdomen is soft.     Tenderness: There is no abdominal tenderness.  Musculoskeletal:        General: No swelling.     Cervical back: Neck supple.  Skin:    General: Skin is warm and dry.     Capillary Refill: Capillary refill takes less than 2 seconds.  Neurological:     Mental Status: She is alert.  Psychiatric:        Mood and Affect: Mood normal.      UC Treatments / Results  Labs (all labs ordered are listed, but only abnormal results are displayed) Labs Reviewed  POC COVID19/FLU A&B COMBO - Normal    EKG   Radiology DG Chest 2 View Result Date: 12/17/2023 CLINICAL DATA:  Acute cough.  Cough, wheezing and chest congestion. EXAM: CHEST - 2 VIEW COMPARISON:  06/15/2015 FINDINGS: Patchy right lower lobe opacity suspicious for pneumonia. Left lung is clear. The heart is normal in size. Normal mediastinal contours. Mild biapical pleuroparenchymal scarring. Pulmonary vasculature is normal. No pleural effusion or pneumothorax. No acute osseous findings. IMPRESSION: Patchy right lower lobe opacity suspicious for pneumonia. Electronically Signed   By: Andrea Gasman M.D.   On: 12/17/2023 17:03     Procedures Procedures (including critical  care time)  Medications Ordered in UC Medications  albuterol  (VENTOLIN  HFA) 108 (90 Base) MCG/ACT inhaler 2 puff (has no administration in time range)  Aerochamber Plus device 1 each (has no administration in time range)    Initial Impression / Assessment and Plan / UC Course  I have reviewed the triage vital signs and the nursing notes.  Pertinent labs & imaging results that were available during my care of the patient were reviewed by me and considered in my medical decision making (see chart for details).   High clinical suspicion for CAP to the right lower lung with underlying bronchitis given length of symptoms and physical exam findings.  Xray with consolidation to the right lower lobe concerning for pneumonia; strep and viral testing negative.  Antibiotic(s), bronchodilator, and supportive care ordered/recommended and outlined in AVS.    Final Clinical Impressions(s) / UC Diagnoses   Final diagnoses:  Acute cough  Community acquired pneumonia of right lower lobe of lung     Discharge Instructions      Antibiotic sent to the pharmacy as well as medications for symptomatic cough relief.  Take Promethazine  DM cough medication to help with your cough at nighttime so that you are able to sleep. Do not drive, drink alcohol, or go to work while taking this medication since it can make you sleepy. Only take this at nighttime. .  If you develop any new or worsening symptoms or if your symptoms do not start to improve, please return here or follow-up with your primary care provider. If your symptoms are severe, please go to the emergency room.        ED Prescriptions     Medication Sig Dispense Auth. Provider   doxycycline  (VIBRAMYCIN ) 100 MG capsule Take 1 capsule (100 mg total) by mouth 2 (two) times daily. 10 capsule Remi Pippin, NP   promethazine -dextromethorphan (PROMETHAZINE -DM) 6.25-15 MG/5ML syrup Take 5 mLs by mouth  4 (four) times daily as needed for cough. 118 mL Remi Pippin, NP   benzonatate  (TESSALON ) 100 MG capsule Take 1 capsule (100 mg total) by mouth every 8 (eight) hours. 21 capsule Remi Pippin, NP      PDMP not reviewed this encounter.   Remi Pippin, NP 12/17/23 1730

## 2023-12-17 NOTE — ED Triage Notes (Signed)
 Pt c/o cough, wheezing, and chest congestion x4 days. 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 Had neg home covid test.

## 2023-12-24 ENCOUNTER — Ambulatory Visit (INDEPENDENT_AMBULATORY_CARE_PROVIDER_SITE_OTHER): Payer: 59 | Admitting: Family Medicine

## 2023-12-24 ENCOUNTER — Other Ambulatory Visit (HOSPITAL_COMMUNITY): Payer: Self-pay

## 2023-12-24 ENCOUNTER — Encounter: Payer: 59 | Admitting: Internal Medicine

## 2023-12-24 VITALS — BP 130/70 | HR 73 | Temp 97.6°F | Ht 67.0 in | Wt 189.6 lb

## 2023-12-24 DIAGNOSIS — J189 Pneumonia, unspecified organism: Secondary | ICD-10-CM

## 2023-12-24 MED ORDER — FLUCONAZOLE 150 MG PO TABS
150.0000 mg | ORAL_TABLET | Freq: Once | ORAL | 0 refills | Status: AC
  Filled 2023-12-24: qty 1, 1d supply, fill #0

## 2023-12-24 MED ORDER — AMOXICILLIN-POT CLAVULANATE 875-125 MG PO TABS
1.0000 | ORAL_TABLET | Freq: Two times a day (BID) | ORAL | 0 refills | Status: DC
  Filled 2023-12-24: qty 20, 10d supply, fill #0

## 2023-12-24 MED ORDER — AZITHROMYCIN 250 MG PO TABS
ORAL_TABLET | ORAL | 0 refills | Status: AC
  Filled 2023-12-24: qty 6, 5d supply, fill #0

## 2023-12-24 NOTE — Progress Notes (Signed)
Subjective:    Patient ID: Amanda Sampson, female    DOB: Jul 31, 1961, 63 y.o.   MRN: [id number]  HPI  Patient is a very pleasant 63 year old Caucasian female who 1 week ago was seen at a local urgent care and diagnosed with right lower lobe pneumonia.  I have not reviewed the x-ray results myself and there is definitely an opacity in the right lower lobe.  She was given doxycycline.  She states that she is not doing any better.  Today on exam, the patient has prominent crackles and rhonchi in her right lower lobe.  She also has inspiratory and expiratory wheezing.  Left lung is clear.  She continues to feel weak and tired.  She denies any sore throat or runny nose but she continues to have purulent sputum. Past Medical History:  Diagnosis Date   Goiter    hypothyroid   Hemorrhoids    Prolapsed external hemorrhoids 06/03/2018   Rectocele    Past Surgical History:  Procedure Laterality Date   BREAST BIOPSY Left    WISDOM TOOTH EXTRACTION     Current Outpatient Medications on File Prior to Visit  Medication Sig Dispense Refill   benzonatate (TESSALON) 100 MG capsule Take 1 capsule (100 mg total) by mouth every 8 (eight) hours. 21 capsule 0   ibuprofen (ADVIL,MOTRIN) 200 MG tablet Take 600 mg by mouth every 6 (six) hours as needed for pain.     levothyroxine (SYNTHROID) 88 MCG tablet Take 1 tablet (88 mcg total) by mouth every morning on an empty stomach 90 tablet 5   Multiple Vitamin (MULTIVITAMIN) tablet Take 1 tablet by mouth daily.     promethazine-dextromethorphan (PROMETHAZINE-DM) 6.25-15 MG/5ML syrup Take 5 mLs by mouth 4 (four) times daily as needed for cough. 118 mL 0   doxycycline (VIBRAMYCIN) 100 MG capsule Take 1 capsule (100 mg total) by mouth 2 (two) times daily. (Patient not taking: Reported on 12/24/2023) 10 capsule 0   No current facility-administered medications on file prior to visit.  No Known Allergies Social History   Socioeconomic History   Marital status: Married     Spouse name: Not on file   Number of children: Not on file   Years of education: Not on file   Highest education level: Not on file  Occupational History   Not on file  Tobacco Use   Smoking status: Never   Smokeless tobacco: Never  Vaping Use   Vaping status: Never Used  Substance and Sexual Activity   Alcohol use: No    Alcohol/week: 0.0 standard drinks of alcohol   Drug use: No   Sexual activity: Never    Birth control/protection: None  Other Topics Concern   Not on file  Social History Narrative   Entered 12/2014:    She works as a Psychologist, 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   She is married.   She has 5 children. The youngest is now 52 years old.   Social Drivers of Corporate investment banker Strain: Not on file  Food Insecurity: Not on file  Transportation Needs: Not on file  Physical Activity: Not on file  Stress: Not on file  Social Connections: Unknown (04/16/2022)   Received from [facility]   Social Network    Social Network: Not on file  Intimate Partner Violence: Unknown (03/08/2022)   Received from [facility]   HITS    Physically Hurt: Not on file    Insult or Talk  Down To: Not on file    Threaten Physical Harm: Not on file    Scream or Curse: Not on file   Family History  Problem Relation Age of Onset   Breast cancer Mother    Cancer Mother 5       breast   Pancreatic cancer Father    Cancer Father        pancreatic   Hearing loss Father    Lymphoma Maternal Grandfather    Cancer Maternal Grandfather        lymphoma   Alcohol abuse Brother    Diabetes Maternal Grandmother    Hearing loss Brother    Learning disabilities Brother    Depression Brother    Depression Brother    Diabetes Sister    Colon cancer Neg Hx    Esophageal cancer Neg Hx    Stomach cancer Neg Hx       Review of Systems  All other systems reviewed and are negative.      Objective:   Physical Exam Vitals reviewed.  Constitutional:      General:  She is not in acute distress.    Appearance: She is well-developed. She is not diaphoretic.  HENT:     Head: Normocephalic and atraumatic.     Right Ear: External ear normal.     Left Ear: External ear normal.     Nose: Nose normal.     Mouth/Throat:     Pharynx: No oropharyngeal exudate.  Eyes:     General: No scleral icterus.       Right eye: No discharge.        Left eye: No discharge.     Conjunctiva/sclera: Conjunctivae normal.     Pupils: Pupils are equal, round, and reactive to light.  Neck:     Thyroid: No thyromegaly.     Vascular: No JVD.     Trachea: No tracheal deviation.  Cardiovascular:     Rate and Rhythm: Normal rate and regular rhythm.     Heart sounds: Normal heart sounds. No murmur heard.    No friction rub. No gallop.  Pulmonary:     Effort: Pulmonary effort is normal. No respiratory distress.     Breath sounds: No stridor. Rhonchi and rales present. No wheezing.    Chest:     Chest wall: No tenderness.  Abdominal:     General: Bowel sounds are normal. There is no distension.     Palpations: Abdomen is soft. There is no mass.     Tenderness: There is no abdominal tenderness. There is no guarding or rebound.  Musculoskeletal:        General: No tenderness or deformity. Normal range of motion.     Cervical back: Normal range of motion and neck supple.  Lymphadenopathy:     Cervical: No cervical adenopathy.  Skin:    General: Skin is warm.     Coloration: Skin is not pale.     Findings: No erythema or rash.  Neurological:     Mental Status: She is alert and oriented to person, place, and time.     Cranial Nerves: No cranial nerve deficit.     Motor: No abnormal muscle tone.     Coordination: Coordination normal.     Deep Tendon Reflexes: Reflexes are normal and symmetric. Reflexes normal.  Psychiatric:        Behavior: Behavior normal.        Thought Content: Thought content normal.  Judgment: Judgment normal.           Assessment &  Plan:  Pneumonia of right lower lobe due to infectious organism I will start the patient on Augmentin 875 mg twice daily for 10 days to cover streptococcal pneumonia and tomorrow also start a Z-Pak to cover atypical bacteria.  Reassess in 1 week if no better or sooner if worse.  Repeat imaging if worsening

## 2024-01-06 ENCOUNTER — Ambulatory Visit: Payer: Self-pay | Admitting: Family Medicine

## 2024-01-06 NOTE — Telephone Encounter (Signed)
Chief Complaint: Productive cough Symptoms: productive cough, mild Frequency: since 01/21 with pneumonia diagnosis Pertinent Negatives: Patient denies fever Disposition: [] ED /[] Urgent Care (no appt availability in office) / [] Appointment(In office/virtual)/ []  [facility]/ [x] Home Care/ [] Refused Recommended Disposition /[] [facility]/ []  Follow-up with PCP Additional Notes: Patient called in stating she is still experiencing a productive cough mildly. Patient was diagnosed in office with Pneumonia on the 21st and has completed a full 10-day round of Augmentin and a round of Z-Pack. Patient states most symptoms have improved but she is still experiencing a productive cough of clear/yellow phlegm. Patient states it occurred roughly 3 times yesterday due to encouraging cough to get the phlegm out. Patient is also using an inhaler at this time. Advised patient to continue monitoring symptoms and if she does not notice improvement within a few days, call office back for further evaluation.    Reason for Disposition  Cough  Answer Assessment - Initial Assessment Questions 1. ONSET: "When did the cough begin?"      01/21 2. SEVERITY: "How bad is the cough today?"      Coughing when I feel like something needs to be coughed up 3. SPUTUM: "Describe the color of your sputum" (none, dry cough; clear, white, yellow, green)     Clear or very light yellow 4. HEMOPTYSIS: "Are you coughing up any blood?" If so ask: "How much?" (flecks, streaks, tablespoons, etc.)     No 5. DIFFICULTY BREATHING: "Are you having difficulty breathing?" If Yes, ask: "How bad is it?" (e.g., mild, moderate, severe)    - MILD: No SOB at rest, mild SOB with walking, speaks normally in sentences, can lie down, no retractions, pulse < 100.    - MODERATE: SOB at rest, SOB with minimal exertion and prefers to sit, cannot lie down flat, speaks in phrases, mild retractions, audible wheezing, pulse 100-120.    -  SEVERE: Very SOB at rest, speaks in single words, struggling to breathe, sitting hunched forward, retractions, pulse > 120      No 6. FEVER: "Do you have a fever?" If Yes, ask: "What is your temperature, how was it measured, and when did it start?"     No 7. CARDIAC HISTORY: "Do you have any history of heart disease?" (e.g., heart attack, congestive heart failure)      No 8. LUNG HISTORY: "Do you have any history of lung disease?"  (e.g., pulmonary embolus, asthma, emphysema)     Recent pneumonia 9. PE RISK FACTORS: "Do you have a history of blood clots?" (or: recent major surgery, recent prolonged travel, bedridden)     No 10. OTHER SYMPTOMS: "Do you have any other symptoms?" (e.g., runny nose, wheezing, chest pain)     No  Protocols used: Cough - Acute Productive-A-AH

## 2024-02-11 ENCOUNTER — Ambulatory Visit (AMBULATORY_SURGERY_CENTER): Payer: 59

## 2024-02-11 VITALS — Ht 67.0 in | Wt 185.0 lb

## 2024-02-11 DIAGNOSIS — Z860100 Personal history of colon polyps, unspecified: Secondary | ICD-10-CM

## 2024-02-11 NOTE — Progress Notes (Signed)
No egg or soy allergy known to patient  No issues known to pt with past sedation with any surgeries or procedures Patient denies ever being told they had issues or difficulty with intubation  No FH of Malignant Hyperthermia Pt is not on diet pills Pt is not on  home 02  Pt is not on blood thinners  Pt denies issues with constipation no No A fib or A flutter Have any cardiac testing pending--no Pt can ambulate independently Pt denies use of chewing tobacco Discussed diabetic I weight loss medication holds Discussed NSAID holds Checked BMI Pt instructed to use Singlecare.com or GoodRx for a price reduction on prep  Patient's chart reviewed by Cathlyn Parsons CNRA prior to previsit and patient appropriate for the [facility].  Pre visit completed and red dot placed by patient's name on their procedure day (on provider's schedule).

## 2024-02-25 ENCOUNTER — Encounter: Payer: 59 | Admitting: Internal Medicine

## 2024-03-25 ENCOUNTER — Other Ambulatory Visit: Payer: Self-pay | Admitting: Endocrinology

## 2024-03-25 DIAGNOSIS — E049 Nontoxic goiter, unspecified: Secondary | ICD-10-CM

## 2024-04-07 ENCOUNTER — Ambulatory Visit
Admission: RE | Admit: 2024-04-07 | Discharge: 2024-04-07 | Disposition: A | Discharge status: Home / Self Care | Source: Ambulatory Visit | Attending: Endocrinology | Admitting: Endocrinology

## 2024-04-07 DIAGNOSIS — E041 Nontoxic single thyroid nodule: Secondary | ICD-10-CM | POA: Diagnosis not present

## 2024-04-07 DIAGNOSIS — E049 Nontoxic goiter, unspecified: Secondary | ICD-10-CM

## 2024-04-21 ENCOUNTER — Other Ambulatory Visit (HOSPITAL_COMMUNITY): Payer: Self-pay

## 2024-04-21 DIAGNOSIS — E049 Nontoxic goiter, unspecified: Secondary | ICD-10-CM | POA: Diagnosis not present

## 2024-04-21 DIAGNOSIS — Z780 Asymptomatic menopausal state: Secondary | ICD-10-CM | POA: Diagnosis not present

## 2024-04-21 DIAGNOSIS — E039 Hypothyroidism, unspecified: Secondary | ICD-10-CM | POA: Diagnosis not present

## 2024-04-21 MED ORDER — LEVOTHYROXINE SODIUM 88 MCG PO TABS
88.0000 ug | ORAL_TABLET | Freq: Every morning | ORAL | 11 refills | Status: DC
  Filled 2024-04-21: qty 30, 30d supply, fill #0
  Filled 2024-05-29: qty 30, 30d supply, fill #1
  Filled 2024-06-26: qty 30, 30d supply, fill #2
  Filled 2024-07-28: qty 30, 30d supply, fill #3
  Filled 2024-08-25: qty 30, 30d supply, fill #4

## 2024-05-29 ENCOUNTER — Other Ambulatory Visit (HOSPITAL_COMMUNITY): Payer: Self-pay

## 2024-08-04 ENCOUNTER — Encounter: Payer: Self-pay | Admitting: Internal Medicine

## 2024-08-10 DIAGNOSIS — E049 Nontoxic goiter, unspecified: Secondary | ICD-10-CM | POA: Insufficient documentation

## 2024-08-10 DIAGNOSIS — E039 Hypothyroidism, unspecified: Secondary | ICD-10-CM | POA: Insufficient documentation

## 2024-08-18 ENCOUNTER — Other Ambulatory Visit (HOSPITAL_COMMUNITY): Payer: Self-pay

## 2024-08-18 ENCOUNTER — Ambulatory Visit (AMBULATORY_SURGERY_CENTER): Admitting: *Deleted

## 2024-08-18 VITALS — Ht 67.0 in | Wt 180.0 lb

## 2024-08-18 DIAGNOSIS — Z860100 Personal history of colon polyps, unspecified: Secondary | ICD-10-CM

## 2024-08-18 MED ORDER — NA SULFATE-K SULFATE-MG SULF 17.5-3.13-1.6 GM/177ML PO SOLN
1.0000 | Freq: Once | ORAL | 0 refills | Status: DC
  Filled 2024-08-18: qty 354, 1d supply, fill #0

## 2024-08-18 NOTE — Progress Notes (Signed)
 Pt's name and DOB verified at the beginning of the pre-visit with 2 identifiers  Pt denies any difficulty with ambulating,sitting, laying down or rolling side to side  Pt has no issues moving head neck or swallowing  No egg or soy allergy known to patient   No issues known to pt with past sedation  No FH of Malignant Hyperthermia  Pt is not on home 02   Pt is not on blood thinners   Pt denies issues with constipation    Pt is not on dialysis  Pt denise any abnormal heart rhythms   Pt denies any upcoming cardiac testing  Patient's chart reviewed by Norleen Schillings CNRA prior to pre-visit and patient appropriate for the [facility].  Pre-visit completed and red dot placed by patient's name on their procedure day (on provider's schedule).    Visit in person  Pt states weight is    Pt given  both [facility] main # and MD on call # prior to instructions.  Informed pt to come in at the time discussed and is shown on PV instructions.  Pt instructed to use Singlecare.com or GoodRx for a price reduction on prep  Instructed pt where to find PV instructions in My Chart  Instructed pt on all aspects of written instructions including med holds clothing to wear and foods to eat and not eat as well as after procedure legal restrictions and to call MD on call if needed.. Pt states understanding. Instructed pt to review instructions again prior to procedure and call main # given if has any questions or any issues. Pt states they will.

## 2024-08-18 NOTE — Addendum Note (Signed)
 Addended by: MALINDA ORIE CROME on: 08/18/2024 08:52 AM   Modules accepted: Orders

## 2024-08-20 ENCOUNTER — Encounter: Payer: Self-pay | Admitting: Internal Medicine

## 2024-08-28 ENCOUNTER — Telehealth: Payer: Self-pay | Admitting: Internal Medicine

## 2024-08-28 NOTE — Telephone Encounter (Signed)
 RN attempted to return call to patient; received vm. RN stated that patient could substitute Pedialyte for Gatorade, as long as the Pedialyte is not red or purple in color.  RN stated that if patient had further questions, she could call us  back.

## 2024-08-28 NOTE — Telephone Encounter (Signed)
 Inbound call from patient requesting a call to discuss if she can take pedialyte instead of gatorade for her prep. Please advise.

## 2024-08-28 NOTE — Telephone Encounter (Signed)
 Spoke with pt all questions answered

## 2024-09-06 NOTE — Progress Notes (Unsigned)
 [facility] History and Physical   Primary Care Physician:  Duanne Butler DASEN, MD   Reason for Procedure:   hx adenomatous colon polyp  Plan:    colonoscopy     HPI: Amanda Sampson is a 63 y.o. female s/p colonoscopy 2019 as below:  - Non-thrombosed external hemorrhoids and perianal skin tags found on perianal exam. - Two diminutive polyps in the descending colon and at the ileocecal valve, removed with a cold snare. Resected and retrieved. - Diverticulosis in the sigmoid colon. - Rectocele. found on digital rectal exam. - External and internal hemorrhoids. - The examination was otherwise normal on direct and retroflexion views.  Pathology = adenoma and benign mucosal polyp. Past Medical History:  Diagnosis Date   Goiter    hypothyroid   Hemorrhoids    Prolapsed external hemorrhoids 06/03/2018   Rectocele     Past Surgical History:  Procedure Laterality Date   BREAST BIOPSY Left    COLONOSCOPY     VAGINAL DELIVERY     x 5   WISDOM TOOTH EXTRACTION       Current Outpatient Medications  Medication Sig Dispense Refill   Cholecalciferol (VITAMIN D3) 50 MCG (2000 UT) CHEW Chew by mouth daily at 12 noon.     ELDERBERRY PO Take by mouth daily at 12 noon.     levothyroxine  (SYNTHROID ) 88 MCG tablet Take 1 tablet (88 mcg total) by mouth every morning on an empty stomach 90 tablet 5   ibuprofen (ADVIL,MOTRIN) 200 MG tablet Take 600 mg by mouth every 6 (six) hours as needed for pain.     levothyroxine  (SYNTHROID ) 88 MCG tablet Take 1 tablet (88 mcg total) by mouth in the morning on an empty stomach. 30 tablet 11   Multiple Vitamin (MULTIVITAMIN) tablet Take 1 tablet by mouth daily.     promethazine -dextromethorphan (PROMETHAZINE -DM) 6.25-15 MG/5ML syrup Take 5 mLs by mouth 4 (four) times daily as needed for cough. (Patient not taking: Reported on 02/11/2024) 118 mL 0   Current Facility-Administered Medications  Medication Dose Route Frequency Provider Last Rate Last  Admin   0.9 %  sodium chloride  infusion  500 mL Intravenous Once Avram Lupita BRAVO, MD        Allergies as of 09/07/2024   (No Known Allergies)    Family History  Problem Relation Age of Onset   Breast cancer Mother    Cancer Mother 50       breast   Pancreatic cancer Father    Cancer Father        pancreatic   Hearing loss Father    Diabetes Sister    Alcohol abuse Brother    Hearing loss Brother    Learning disabilities Brother    Depression Brother    Depression Brother    Diabetes Maternal Grandmother    Lymphoma Maternal Grandfather    Cancer Maternal Grandfather        lymphoma   Colon cancer Neg Hx    Esophageal cancer Neg Hx    Stomach cancer Neg Hx    Colon polyps Neg Hx    Rectal cancer Neg Hx     Social History   Socioeconomic History   Marital status: Married    Spouse name: Not on file   Number of children: Not on file   Years of education: Not on file   Highest education level: Not on file  Occupational History   Not on file  Tobacco Use   Smoking status:  Never   Smokeless tobacco: Never  Vaping Use   Vaping status: Never Used  Substance and Sexual Activity   Alcohol use: No    Alcohol/week: 0.0 standard drinks of alcohol   Drug use: No   Sexual activity: Never    Birth control/protection: None  Other Topics Concern   Not on file  Social History Narrative   Entered 12/2014:    She works as a Psychologist, 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   She is married.   She has 5 children. The youngest is now 38 years old.   Social Drivers of Corporate investment banker Strain: Not on file  Food Insecurity: Not on file  Transportation Needs: Not on file  Physical Activity: Not on file  Stress: Not on file  Social Connections: Unknown (04/16/2022)   Received from [facility]   Social Network    Social Network: Not on file  Intimate Partner Violence: Unknown (03/08/2022)   Received from [facility]   HITS    Physically Hurt: Not on file     Insult or Talk Down To: Not on file    Threaten Physical Harm: Not on file    Scream or Curse: Not on file    Review of Systems:  All other review of systems negative except as mentioned in the HPI.  Physical Exam: Vital signs BP (!) 154/91   Pulse (!) 56   Temp 98.1 F (36.7 C)   Resp 12   Ht 5' 7 (1.702 m)   Wt 180 lb (81.6 kg)   LMP  (LMP Unknown) Comment: irregular  SpO2 98%   BMI 28.19 kg/m   General:   Alert,  Well-developed, well-nourished, pleasant and cooperative in NAD Lungs:  Clear throughout to auscultation.   Heart:  Regular rate and rhythm; no murmurs, clicks, rubs,  or gallops. Abdomen:  Soft, nontender and nondistended. Normal bowel sounds.   Neuro/Psych:  Alert and cooperative. Normal mood and affect. A and O x 3   @Lochlin Eppinger  CHARLENA Commander, MD, [facility] [phone] (pager) 09/07/2024 4:10 PM@

## 2024-09-07 ENCOUNTER — Encounter: Payer: Self-pay | Admitting: Internal Medicine

## 2024-09-07 ENCOUNTER — Ambulatory Visit (AMBULATORY_SURGERY_CENTER): Admitting: Internal Medicine

## 2024-09-07 VITALS — BP 125/64 | HR 64 | Temp 98.1°F | Resp 16 | Ht 67.0 in | Wt 180.0 lb

## 2024-09-07 DIAGNOSIS — Z1211 Encounter for screening for malignant neoplasm of colon: Secondary | ICD-10-CM

## 2024-09-07 DIAGNOSIS — E039 Hypothyroidism, unspecified: Secondary | ICD-10-CM | POA: Diagnosis not present

## 2024-09-07 DIAGNOSIS — K648 Other hemorrhoids: Secondary | ICD-10-CM | POA: Diagnosis not present

## 2024-09-07 DIAGNOSIS — K644 Residual hemorrhoidal skin tags: Secondary | ICD-10-CM

## 2024-09-07 DIAGNOSIS — K5730 Diverticulosis of large intestine without perforation or abscess without bleeding: Secondary | ICD-10-CM | POA: Diagnosis not present

## 2024-09-07 DIAGNOSIS — Z860100 Personal history of colon polyps, unspecified: Secondary | ICD-10-CM

## 2024-09-07 DIAGNOSIS — Z860101 Personal history of adenomatous and serrated colon polyps: Secondary | ICD-10-CM | POA: Diagnosis not present

## 2024-09-07 MED ORDER — SODIUM CHLORIDE 0.9 % IV SOLN: 500.0000 mL | Freq: Once | INTRAVENOUS | Status: DC

## 2024-09-07 NOTE — Op Note (Signed)
 [facility] Patient Name: Amanda Sampson Procedure Date: 09/07/2024 3:55 PM MRN: [id number] Endoscopist: Lupita FORBES Commander , MD, [id number] Age: 63 Referring MD:  Date of Birth: [birth year] Gender: Female Account #: [id number] Procedure:                Colonoscopy Indications:              Surveillance: Personal history of adenomatous                            polyps on last colonoscopy > 5 years ago, Last                            colonoscopy: 2019 Medicines:                Monitored Anesthesia Care Procedure:                Pre-Anesthesia Assessment:                           - Prior to the procedure, a History and Physical                            was performed, and patient medications and                            allergies were reviewed. The patient's tolerance of                            previous anesthesia was also reviewed. The risks                            and benefits of the procedure and the sedation                            options and risks were discussed with the patient.                            All questions were answered, and informed consent                            was obtained. Prior Anticoagulants: The patient has                            taken no anticoagulant or antiplatelet agents. ASA                            Grade Assessment: II - A patient with mild systemic                            disease. After reviewing the risks and benefits,                            the patient was deemed in satisfactory condition to  undergo the procedure.                           After obtaining informed consent, the colonoscope                            was passed under direct vision. Throughout the                            procedure, the patient's blood pressure, pulse, and                            oxygen saturations were monitored continuously. The                            CF [id number] #[id number] was introduced through the  anus                            and advanced to the the cecum, identified by                            appendiceal orifice and ileocecal valve. The                            colonoscopy was somewhat difficult due to prep                            issues. Successful completion of the procedure was                            aided by lavage. The patient tolerated the                            procedure well. The quality of the bowel                            preparation was adequate. The ileocecal valve,                            appendiceal orifice, and rectum were photographed.                            The bowel preparation used was SUPREP via split                            dose instruction. Scope In: 4:22:12 PM Scope Out: 4:45:05 PM Scope Withdrawal Time: 0 hours 17 minutes 46 seconds  Total Procedure Duration: 0 hours 22 minutes 53 seconds  Findings:                 Hemorrhoids were found on perianal exam.                           Multiple diverticula were found in the sigmoid  colon and descending colon.                           External and internal hemorrhoids were found.                           The exam was otherwise without abnormality on                            direct and retroflexion views. Complications:            No immediate complications. Estimated Blood Loss:     Estimated blood loss: none. Impression:               - Hemorrhoids found on perianal exam. Large and                            friable/inflamed                           - Diverticulosis in the sigmoid colon and in the                            descending colon.                           - External and internal hemorrhoids.                           - The examination was otherwise normal on direct                            and retroflexion views. Prep was adequate after                            extensive lavage                           - No specimens collected.                            - Personal history of colonic polyp diminutive                            adenoma 2019. Recommendation:           - Patient has a contact number available for                            emergencies. The signs and symptoms of potential                            delayed complications were discussed with the                            patient. Return to normal activities tomorrow.  Written discharge instructions were provided to the                            patient.                           - Resume previous diet.                           - Continue present medications.                           - Repeat colonoscopy in 5 years due to hx polyp,                            lavage required to get adequate prep and deep folds                            in left colon.                           -Will discuss referral for hemorrhoid excision if                            desired Lupita FORBES Commander, MD 09/07/2024 4:57:29 PM This report has been signed electronically.

## 2024-09-07 NOTE — Progress Notes (Signed)
 Report given to PACU, vss

## 2024-09-07 NOTE — Patient Instructions (Addendum)
 No polyps or cancer seen today.  You do have diverticulosis - thickened muscle rings and pouches in the colon wall. Please read the handout about this condition.  As you know you have large hemorrhoids and they were irritated and inflamed.  I do think you would be a candidate for hemorrhoidectomy, if desired - can refer you to a surgeon.  Your next routine colonoscopy should be in 5 years - 2030.  I appreciate the opportunity to care for you. Lupita CHARLENA Commander, MD, FACG    YOU HAD AN ENDOSCOPIC PROCEDURE TODAY AT THE [facility]:   Refer to the procedure report that was given to you for any specific questions about what was found during the examination.  If the procedure report does not answer your questions, please call your gastroenterologist to clarify.  If you requested that your care partner not be given the details of your procedure findings, then the procedure report has been included in a sealed envelope for you to review at your convenience later.  YOU SHOULD EXPECT: Some feelings of bloating in the abdomen. Passage of more gas than usual.  Walking can help get rid of the air that was put into your GI tract during the procedure and reduce the bloating. If you had a lower endoscopy (such as a colonoscopy or flexible sigmoidoscopy) you may notice spotting of blood in your stool or on the toilet paper. If you underwent a bowel prep for your procedure, you may not have a normal bowel movement for a few days.  Please Note:  You might notice some irritation and congestion in your nose or some drainage.  This is from the oxygen used during your procedure.  There is no need for concern and it should clear up in a day or so.  SYMPTOMS TO REPORT IMMEDIATELY:  Following lower endoscopy (colonoscopy or flexible sigmoidoscopy):  Excessive amounts of blood in the stool  Significant tenderness or worsening of abdominal pains  Swelling of the abdomen that is new, acute  Fever of 100F  or higher  Following upper endoscopy (EGD)  Vomiting of blood or coffee ground material  New chest pain or pain under the shoulder blades  Painful or persistently difficult swallowing  New shortness of breath  Fever of 100F or higher  Black, tarry-looking stools  For urgent or emergent issues, a gastroenterologist can be reached at any hour by calling [phone]. Do not use MyChart messaging for urgent concerns.    DIET:  We do recommend a small meal at first, but then you may proceed to your regular diet.  Drink plenty of fluids but you should avoid alcoholic beverages for 24 hours.  ACTIVITY:  You should plan to take it easy for the rest of today and you should NOT DRIVE or use heavy machinery until tomorrow (because of the sedation medicines used during the test).    FOLLOW UP: Our staff will call the number listed on your records the next business day following your procedure.  We will call around 7:15- 8:00 am to check on you and address any questions or concerns that you may have regarding the information given to you following your procedure. If we do not reach you, we will leave a message.     If any biopsies were taken you will be contacted by phone or by letter within the next 1-3 weeks.  Please call us  at [phone] if you have not heard about the biopsies in 3 weeks.  SIGNATURES/CONFIDENTIALITY: You and/or your care partner have signed paperwork which will be entered into your electronic medical record.  These signatures attest to the fact that that the information above on your After Visit Summary has been reviewed and is understood.  Full responsibility of the confidentiality of this discharge information lies with you and/or your care-partner.

## 2024-09-07 NOTE — Progress Notes (Signed)
 Pt's states no medical or surgical changes since previsit or office visit.

## 2024-09-08 ENCOUNTER — Telehealth: Payer: Self-pay | Admitting: *Deleted

## 2024-09-08 NOTE — Telephone Encounter (Signed)
  Follow up Call-     09/07/2024    3:18 PM  Call back number  Post procedure Call Back phone  # [phone]  Permission to leave phone message Yes    Post procedure follow up phone call. No answer at number given.  Left message on voicemail.

## 2024-09-15 ENCOUNTER — Encounter: Admitting: Internal Medicine

## 2024-09-22 ENCOUNTER — Ambulatory Visit (INDEPENDENT_AMBULATORY_CARE_PROVIDER_SITE_OTHER): Payer: 59 | Admitting: Family Medicine

## 2024-09-22 ENCOUNTER — Encounter: Payer: Self-pay | Admitting: Family Medicine

## 2024-09-22 VITALS — BP 120/62 | HR 55 | Temp 97.7°F | Ht 67.0 in | Wt 178.0 lb

## 2024-09-22 DIAGNOSIS — E7800 Pure hypercholesterolemia, unspecified: Secondary | ICD-10-CM

## 2024-09-22 DIAGNOSIS — R739 Hyperglycemia, unspecified: Secondary | ICD-10-CM

## 2024-09-22 DIAGNOSIS — E039 Hypothyroidism, unspecified: Secondary | ICD-10-CM | POA: Diagnosis not present

## 2024-09-22 DIAGNOSIS — Z1231 Encounter for screening mammogram for malignant neoplasm of breast: Secondary | ICD-10-CM | POA: Diagnosis not present

## 2024-09-22 DIAGNOSIS — Z0000 Encounter for general adult medical examination without abnormal findings: Secondary | ICD-10-CM

## 2024-09-22 DIAGNOSIS — Z23 Encounter for immunization: Secondary | ICD-10-CM

## 2024-09-22 DIAGNOSIS — Z0001 Encounter for general adult medical examination with abnormal findings: Secondary | ICD-10-CM | POA: Diagnosis not present

## 2024-09-22 NOTE — Addendum Note (Signed)
 Addended by: ERIKA ELIDA RAMAN on: 09/22/2024 09:32 AM   Modules accepted: Orders

## 2024-09-22 NOTE — Progress Notes (Signed)
 Subjective:    Patient ID: Amanda Sampson, female    DOB: Sep 27, 1961, 63 y.o.   MRN: [id number]  HPI   Patient is a very pleasant 63 year old Caucasian femalemale here today for physical exam.  Patient had a colonoscopy this year.  They recommended repeat colonoscopy in 2030.  Her last mammogram was in November 2024.  That is due.  She is not due for a bone density test until age 12 although I did recommend calcium 1200 mg a day and vitamin D  at 1000 units a day.  Her last Pap smear was 2019.  This is due.  We discussed this at length with the patient declines a Pap smear today.  Otherwise she is doing well with no concerns.  Her blood pressure is excellent at 120/62.  She is due for a pneumonia vaccine, shingles vaccine, RSV, flu shot, and COVID shot.  She declines all vaccinations today except for the shingles vaccine.  She would like to get fasting lab work.  Last year, her sugar was 122.  Therefore I would like to check an A1c.  Her cholesterol was mildly elevated Past Medical History:  Diagnosis Date   Goiter    hypothyroid   Hemorrhoids    Prolapsed external hemorrhoids 06/03/2018   Rectocele    Past Surgical History:  Procedure Laterality Date   BREAST BIOPSY Left    COLONOSCOPY     VAGINAL DELIVERY     x 5   WISDOM TOOTH EXTRACTION     Current Outpatient Medications on File Prior to Visit  Medication Sig Dispense Refill   Cholecalciferol (VITAMIN D3) 50 MCG (2000 UT) CHEW Chew by mouth daily at 12 noon.     ELDERBERRY PO Take by mouth daily at 12 noon.     ibuprofen (ADVIL,MOTRIN) 200 MG tablet Take 600 mg by mouth every 6 (six) hours as needed for pain.     levothyroxine  (SYNTHROID ) 88 MCG tablet Take 1 tablet (88 mcg total) by mouth every morning on an empty stomach 90 tablet 5   levothyroxine  (SYNTHROID ) 88 MCG tablet Take 1 tablet (88 mcg total) by mouth in the morning on an empty stomach. 30 tablet 11   Multiple Vitamin (MULTIVITAMIN) tablet Take 1 tablet by mouth daily.      promethazine -dextromethorphan (PROMETHAZINE -DM) 6.25-15 MG/5ML syrup Take 5 mLs by mouth 4 (four) times daily as needed for cough. (Patient not taking: Reported on 09/22/2024) 118 mL 0   No current facility-administered medications on file prior to visit.  No Known Allergies Social History   Socioeconomic History   Marital status: Married    Spouse name: Not on file   Number of children: Not on file   Years of education: Not on file   Highest education level: Not on file  Occupational History   Not on file  Tobacco Use   Smoking status: Never   Smokeless tobacco: Never  Vaping Use   Vaping status: Never Used  Substance and Sexual Activity   Alcohol use: No    Alcohol/week: 0.0 standard drinks of alcohol   Drug use: No   Sexual activity: Never    Birth control/protection: None  Other Topics Concern   Not on file  Social History Narrative   Entered 12/2014:    She works as a Psychologist, 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   She is married.   She has 5 children. The youngest is now 64 years old.   Social Drivers of  Health   Financial Resource Strain: Not on file  Food Insecurity: Not on file  Transportation Needs: Not on file  Physical Activity: Not on file  Stress: Not on file  Social Connections: Unknown (04/16/2022)   Received from [facility]   Social Network    Social Network: Not on file  Intimate Partner Violence: Unknown (03/08/2022)   Received from [facility]   HITS    Physically Hurt: Not on file    Insult or Talk Down To: Not on file    Threaten Physical Harm: Not on file    Scream or Curse: Not on file   Family History  Problem Relation Age of Onset   Breast cancer Mother    Cancer Mother 24       breast   Pancreatic cancer Father    Cancer Father        pancreatic   Hearing loss Father    Diabetes Sister    Alcohol abuse Brother    Hearing loss Brother    Learning disabilities Brother    Depression Brother    Depression Brother    Diabetes  Maternal Grandmother    Lymphoma Maternal Grandfather    Cancer Maternal Grandfather        lymphoma   Colon cancer Neg Hx    Esophageal cancer Neg Hx    Stomach cancer Neg Hx    Colon polyps Neg Hx    Rectal cancer Neg Hx       Review of Systems  All other systems reviewed and are negative.      Objective:   Physical Exam Vitals reviewed.  Constitutional:      General: She is not in acute distress.    Appearance: She is well-developed. She is not diaphoretic.  HENT:     Head: Normocephalic and atraumatic.     Right Ear: External ear normal.     Left Ear: External ear normal.     Nose: Nose normal.     Mouth/Throat:     Pharynx: No oropharyngeal exudate.  Eyes:     General: No scleral icterus.       Right eye: No discharge.        Left eye: No discharge.     Conjunctiva/sclera: Conjunctivae normal.     Pupils: Pupils are equal, round, and reactive to light.  Neck:     Thyroid : No thyromegaly.     Vascular: No JVD.     Trachea: No tracheal deviation.  Cardiovascular:     Rate and Rhythm: Normal rate and regular rhythm.     Heart sounds: Normal heart sounds. No murmur heard.    No friction rub. No gallop.  Pulmonary:     Effort: Pulmonary effort is normal. No respiratory distress.     Breath sounds: Normal breath sounds. No stridor. No wheezing or rales.  Chest:     Chest wall: No tenderness.  Abdominal:     General: Bowel sounds are normal. There is no distension.     Palpations: Abdomen is soft. There is no mass.     Tenderness: There is no abdominal tenderness. There is no guarding or rebound.  Musculoskeletal:        General: No tenderness or deformity. Normal range of motion.     Cervical back: Normal range of motion and neck supple.  Lymphadenopathy:     Cervical: No cervical adenopathy.  Skin:    General: Skin is warm.     Coloration:  Skin is not pale.     Findings: No erythema or rash.  Neurological:     Mental Status: She is alert and oriented to  person, place, and time.     Cranial Nerves: No cranial nerve deficit.     Motor: No abnormal muscle tone.     Coordination: Coordination normal.     Deep Tendon Reflexes: Reflexes are normal and symmetric. Reflexes normal.  Psychiatric:        Behavior: Behavior normal.        Thought Content: Thought content normal.        Judgment: Judgment normal.           Assessment & Plan:  Encounter for screening mammogram for malignant neoplasm of breast - Plan: MM Digital Screening  Hypothyroidism, unspecified type - Plan: TSH  Pure hypercholesterolemia - Plan: CBC with Differential/Platelet, Comprehensive metabolic panel with GFR, Lipid panel  Elevated blood sugar - Plan: Hemoglobin A1c  General medical exam Physical exam today is normal.  I recommended a flu shot, pneumonia vaccine, and a shingles vaccine.  We discussed the other vaccines.  Patient elects to receive the first shingles vaccine today.  I will schedule her for mammogram.  I recommended 1 more Pap smear but she deferred that today.  Her colonoscopy is due  in 2030.  I will do a bone density test next year but I did recommend 1200 mg a day of calcium and 1000 units a day of vitamin D .  I will check a CBC, CMP, lipid panel, TSH, and hemoglobin A1c.

## 2024-09-23 LAB — COMPREHENSIVE METABOLIC PANEL WITH GFR
AG Ratio: 2 (calc) (ref 1.0–2.5)
ALT: 15 U/L (ref 6–29)
AST: 18 U/L (ref 10–35)
Albumin: 4.5 g/dL (ref 3.6–5.1)
Alkaline phosphatase (APISO): 68 U/L (ref 37–153)
BUN: 13 mg/dL (ref 7–25)
CO2: 26 mmol/L (ref 20–32)
Calcium: 9.6 mg/dL (ref 8.6–10.4)
Chloride: 106 mmol/L (ref 98–110)
Creat: 0.83 mg/dL (ref 0.50–1.05)
Globulin: 2.3 g/dL (ref 1.9–3.7)
Glucose, Bld: 88 mg/dL (ref 65–99)
Potassium: 4.3 mmol/L (ref 3.5–5.3)
Sodium: 141 mmol/L (ref 135–146)
Total Bilirubin: 0.9 mg/dL (ref 0.2–1.2)
Total Protein: 6.8 g/dL (ref 6.1–8.1)
eGFR: 79 mL/min/1.73m2 (ref >=60)

## 2024-09-23 LAB — CBC WITH DIFFERENTIAL/PLATELET
Absolute Lymphocytes: 1362 {cells}/uL (ref 850–3900)
Absolute Monocytes: 524 {cells}/uL (ref 200–950)
Basophils Absolute: 60 {cells}/uL (ref 0–200)
Basophils Relative: 1.3 %
Eosinophils Absolute: 345 {cells}/uL (ref 15–500)
Eosinophils Relative: 7.5 %
HCT: 47.9 % — ABNORMAL HIGH (ref 35.0–45.0)
Hemoglobin: 15.4 g/dL (ref 11.7–15.5)
MCH: 29.7 pg (ref 27.0–33.0)
MCHC: 32.2 g/dL (ref 32.0–36.0)
MCV: 92.5 fL (ref 80.0–100.0)
MPV: 11.8 fL (ref 7.5–12.5)
Monocytes Relative: 11.4 %
Neutro Abs: 2309 {cells}/uL (ref 1500–7800)
Neutrophils Relative %: 50.2 %
Platelets: 191 Thousand/uL (ref 140–400)
RBC: 5.18 Million/uL — ABNORMAL HIGH (ref 3.80–5.10)
RDW: 12.6 % (ref 11.0–15.0)
Total Lymphocyte: 29.6 %
WBC: 4.6 Thousand/uL (ref 3.8–10.8)

## 2024-09-23 LAB — LIPID PANEL
Cholesterol: 179 mg/dL (ref <200)
HDL: 61 mg/dL (ref >=50)
LDL Cholesterol (Calc): 103 mg/dL — ABNORMAL HIGH
Non-HDL Cholesterol (Calc): 118 mg/dL (ref <130)
Total CHOL/HDL Ratio: 2.9 (calc) (ref <5.0)
Triglycerides: 64 mg/dL (ref <150)

## 2024-09-23 LAB — HEMOGLOBIN A1C
Hgb A1c MFr Bld: 5.7 % — ABNORMAL HIGH (ref <5.7)
Mean Plasma Glucose: 117 mg/dL
eAG (mmol/L): 6.5 mmol/L

## 2024-09-23 LAB — TSH: TSH: 0.25 m[IU]/L — ABNORMAL LOW (ref 0.40–4.50)

## 2024-09-24 ENCOUNTER — Ambulatory Visit: Payer: Self-pay | Admitting: Family Medicine

## 2024-09-29 ENCOUNTER — Other Ambulatory Visit (HOSPITAL_COMMUNITY): Payer: Self-pay

## 2024-09-29 ENCOUNTER — Other Ambulatory Visit: Payer: Self-pay | Admitting: Family Medicine

## 2024-09-30 ENCOUNTER — Other Ambulatory Visit (HOSPITAL_COMMUNITY): Payer: Self-pay

## 2024-10-01 ENCOUNTER — Other Ambulatory Visit: Payer: Self-pay

## 2024-10-01 ENCOUNTER — Encounter (HOSPITAL_COMMUNITY): Payer: Self-pay

## 2024-10-01 ENCOUNTER — Other Ambulatory Visit (HOSPITAL_COMMUNITY): Payer: Self-pay

## 2024-10-01 MED ORDER — LEVOTHYROXINE SODIUM 75 MCG PO TABS
75.0000 ug | ORAL_TABLET | Freq: Every day | ORAL | 3 refills | Status: AC
  Filled 2024-10-01: qty 30, 30d supply, fill #0
  Filled 2024-10-31: qty 30, 30d supply, fill #1
  Filled 2024-11-30: qty 30, 30d supply, fill #2
  Filled 2024-12-29: qty 30, 30d supply, fill #3

## 2024-10-01 NOTE — Telephone Encounter (Signed)
 Requested medications are due for refill today.  See note.  Requested medications are on the active medications list.  yes  Last refill. 04/21/2024 #30 11 rf  Future visit scheduled.   Yes - next year  Notes to clinic.  Pharmacy comment: Pt requesting decreased dose be sent . Abnormal labs    Requested Prescriptions  Pending Prescriptions Disp Refills   levothyroxine  (SYNTHROID ) 88 MCG tablet 30 tablet 11    Sig: Take 1 tablet (88 mcg total) by mouth in the morning on an empty stomach.     Endocrinology:  Hypothyroid Agents Failed - 10/01/2024 11:35 AM      Failed - TSH in normal range and within 360 days    TSH  Date Value Ref Range Status  09/22/2024 0.25 (L) 0.40 - 4.50 mIU/L Final         Passed - Valid encounter within last 12 months    Recent Outpatient Visits           1 week ago Encounter for screening mammogram for malignant neoplasm of breast   [facility] Duanne Butler DASEN, MD   9 months ago Pneumonia of right lower lobe due to infectious organism   [facility] Pickard, Butler DASEN, MD   1 year ago Encounter for screening mammogram for malignant neoplasm of breast   [facility] Pickard, Butler DASEN, MD

## 2024-10-02 ENCOUNTER — Other Ambulatory Visit (HOSPITAL_COMMUNITY): Payer: Self-pay

## 2024-10-26 ENCOUNTER — Ambulatory Visit

## 2024-11-25 ENCOUNTER — Other Ambulatory Visit (HOSPITAL_COMMUNITY): Payer: Self-pay

## 2024-12-01 ENCOUNTER — Other Ambulatory Visit (HOSPITAL_COMMUNITY): Payer: Self-pay

## 2024-12-01 ENCOUNTER — Other Ambulatory Visit: Payer: Self-pay

## 2024-12-29 ENCOUNTER — Other Ambulatory Visit (HOSPITAL_COMMUNITY): Payer: Self-pay

## 2025-09-28 ENCOUNTER — Encounter: Admitting: Family Medicine
